# Patient Record
Sex: Male | Born: 1960 | Race: White | Hispanic: No | Smoking: Never smoker
Health system: Southern US, Community
[De-identification: ages and names within clinical notes are randomized; demographics above are authoritative.]

## PROBLEM LIST (undated history)

## (undated) DIAGNOSIS — Z87442 Personal history of urinary calculi: Secondary | ICD-10-CM

## (undated) HISTORY — PX: KNEE SURGERY: SHX244

---

## 2018-02-08 ENCOUNTER — Encounter (HOSPITAL_BASED_OUTPATIENT_CLINIC_OR_DEPARTMENT_OTHER): Payer: Self-pay

## 2018-02-08 ENCOUNTER — Emergency Department (HOSPITAL_BASED_OUTPATIENT_CLINIC_OR_DEPARTMENT_OTHER): Payer: BLUE CROSS/BLUE SHIELD

## 2018-02-08 ENCOUNTER — Other Ambulatory Visit: Payer: Self-pay

## 2018-02-08 ENCOUNTER — Emergency Department (HOSPITAL_BASED_OUTPATIENT_CLINIC_OR_DEPARTMENT_OTHER)
Admission: EM | Admit: 2018-02-08 | Discharge: 2018-02-08 | Disposition: A | Discharge status: Home / Self Care | Payer: BLUE CROSS/BLUE SHIELD | Attending: Emergency Medicine | Admitting: Emergency Medicine

## 2018-02-08 DIAGNOSIS — N200 Calculus of kidney: Secondary | ICD-10-CM | POA: Diagnosis not present

## 2018-02-08 DIAGNOSIS — R1032 Left lower quadrant pain: Secondary | ICD-10-CM | POA: Diagnosis present

## 2018-02-08 LAB — COMPREHENSIVE METABOLIC PANEL
ALK PHOS: 57 U/L (ref 38–126)
ALT: 42 U/L (ref 17–63)
ANION GAP: 11 (ref 5–15)
AST: 39 U/L (ref 15–41)
Albumin: 4.9 g/dL (ref 3.5–5.0)
BUN: 16 mg/dL (ref 6–20)
CALCIUM: 9.3 mg/dL (ref 8.9–10.3)
CHLORIDE: 107 mmol/L (ref 101–111)
CO2: 24 mmol/L (ref 22–32)
CREATININE: 1.39 mg/dL — AB (ref 0.61–1.24)
GFR, EST NON AFRICAN AMERICAN: 54 mL/min — AB (ref >60)
Glucose, Bld: 134 mg/dL — ABNORMAL HIGH (ref 65–99)
Potassium: 4.6 mmol/L (ref 3.5–5.1)
SODIUM: 142 mmol/L (ref 135–145)
Total Bilirubin: 0.7 mg/dL (ref 0.3–1.2)
Total Protein: 7.9 g/dL (ref 6.5–8.1)

## 2018-02-08 LAB — URINALYSIS, ROUTINE W REFLEX MICROSCOPIC
BILIRUBIN URINE: NEGATIVE
Glucose, UA: NEGATIVE mg/dL
KETONES UR: NEGATIVE mg/dL
LEUKOCYTES UA: NEGATIVE
NITRITE: NEGATIVE
Protein, ur: 30 mg/dL — AB
Specific Gravity, Urine: >1.03 — ABNORMAL HIGH (ref 1.005–1.030)
pH: 6 (ref 5.0–8.0)

## 2018-02-08 LAB — CBC WITH DIFFERENTIAL/PLATELET
Basophils Absolute: 0 10*3/uL (ref 0.0–0.1)
Basophils Relative: 0 %
EOS ABS: 0 10*3/uL (ref 0.0–0.7)
EOS PCT: 0 %
HCT: 46.3 % (ref 39.0–52.0)
Hemoglobin: 16.1 g/dL (ref 13.0–17.0)
LYMPHS ABS: 1.5 10*3/uL (ref 0.7–4.0)
Lymphocytes Relative: 11 %
MCH: 29.5 pg (ref 26.0–34.0)
MCHC: 34.8 g/dL (ref 30.0–36.0)
MCV: 85 fL (ref 78.0–100.0)
MONOS PCT: 4 %
Monocytes Absolute: 0.5 10*3/uL (ref 0.1–1.0)
Neutro Abs: 11.5 10*3/uL — ABNORMAL HIGH (ref 1.7–7.7)
Neutrophils Relative %: 85 %
PLATELETS: 216 10*3/uL (ref 150–400)
RBC: 5.45 MIL/uL (ref 4.22–5.81)
RDW: 15.1 % (ref 11.5–15.5)
WBC: 13.5 10*3/uL — ABNORMAL HIGH (ref 4.0–10.5)

## 2018-02-08 LAB — URINALYSIS, MICROSCOPIC (REFLEX)

## 2018-02-08 LAB — LIPASE, BLOOD: LIPASE: 37 U/L (ref 11–51)

## 2018-02-08 MED ORDER — TAMSULOSIN HCL 0.4 MG PO CAPS: 0.4000 mg | ORAL_CAPSULE | Freq: Every day | ORAL | 0 refills | Status: AC

## 2018-02-08 MED ORDER — KETOROLAC TROMETHAMINE 10 MG PO TABS: 10.0000 mg | ORAL_TABLET | Freq: Four times a day (QID) | ORAL | 0 refills | Status: DC | PRN

## 2018-02-08 MED ORDER — ONDANSETRON HCL 4 MG/2ML IJ SOLN
4.0000 mg | Freq: Once | INTRAMUSCULAR | Status: AC
  Administered 2018-02-08: 4 mg via INTRAVENOUS
  Filled 2018-02-08: qty 2

## 2018-02-08 MED ORDER — IOPAMIDOL (ISOVUE-300) INJECTION 61%
100.0000 mL | Freq: Once | INTRAVENOUS | Status: AC | PRN
  Administered 2018-02-08: 100 mL via INTRAVENOUS

## 2018-02-08 MED ORDER — SODIUM CHLORIDE 0.9 % IV BOLUS
1000.0000 mL | Freq: Once | INTRAVENOUS | Status: AC
  Administered 2018-02-08: 1000 mL via INTRAVENOUS

## 2018-02-08 MED ORDER — KETOROLAC TROMETHAMINE 30 MG/ML IJ SOLN
30.0000 mg | Freq: Once | INTRAMUSCULAR | Status: AC
  Administered 2018-02-08: 30 mg via INTRAVENOUS
  Filled 2018-02-08: qty 1

## 2018-02-08 MED ORDER — HYDROCODONE-ACETAMINOPHEN 5-325 MG PO TABS: 1.0000 | ORAL_TABLET | Freq: Four times a day (QID) | ORAL | 0 refills | Status: DC | PRN

## 2018-02-08 NOTE — ED Notes (Signed)
ED Provider at bedside. 

## 2018-02-08 NOTE — ED Provider Notes (Signed)
Emergency Department Provider Note   I have reviewed the triage vital signs and the nursing notes.   HISTORY  Chief Complaint Abdominal Pain   HPI Duane Gregory is a 57 y.o. male presents to the ED with LLQ abdominal pain which began abruptly. Patient reports some radiation of symptoms to the flank. No prior history of kidney stones or diverticulitis. No diarrhea or vomiting. No fever or chills. Patient denies any abdominal surgery history. No modifying factors. Pain is severe and constant.   History reviewed. No pertinent past medical history.  There are no active problems to display for this patient.   Past Surgical History:  Procedure Laterality Date  . KNEE SURGERY        Allergies Patient has no known allergies.  No family history on file.  Social History Social History   Tobacco Use  . Smoking status: Never Smoker  . Smokeless tobacco: Never Used  Substance Use Topics  . Alcohol use: Yes    Comment: occ  . Drug use: Never    Review of Systems  Constitutional: No fever/chills Eyes: No visual changes. ENT: No sore throat. Cardiovascular: Denies chest pain. Respiratory: Denies shortness of breath. Gastrointestinal: Positive LLQ abdominal/flank pain. Positive nausea, no vomiting.  No diarrhea.  No constipation. Genitourinary: Negative for dysuria. Musculoskeletal: Negative for back pain. Skin: Negative for rash. Neurological: Negative for headaches, focal weakness or numbness.  10-point ROS otherwise negative.  ____________________________________________   PHYSICAL EXAM:  VITAL SIGNS: ED Triage Vitals  Enc Vitals Group     BP 02/08/18 1813 (!) 153/96     Pulse Rate 02/08/18 1813 71     Resp 02/08/18 1813 20     Temp 02/08/18 1813 97.9 F (36.6 C)     Temp Source 02/08/18 1813 Oral     SpO2 02/08/18 1813 100 %     Weight 02/08/18 1851 230 lb (104.3 kg)     Height 02/08/18 1851 6' (1.829 m)     Pain Score 02/08/18 1810 4    Constitutional: Alert and oriented. Well appearing and in no acute distress. Eyes: Conjunctivae are normal.  Head: Atraumatic. Nose: No congestion/rhinnorhea. Mouth/Throat: Mucous membranes are moist.  Neck: No stridor.  Cardiovascular: Normal rate, regular rhythm. Good peripheral circulation. Grossly normal heart sounds.   Respiratory: Normal respiratory effort.  No retractions. Lungs CTAB. Gastrointestinal: Soft with focal LLQ abdominal pain without rebound or guarding. No distention.  Musculoskeletal: No lower extremity tenderness nor edema. No gross deformities of extremities. Neurologic:  Normal speech and language. No gross focal neurologic deficits are appreciated.  Skin:  Skin is warm, dry and intact. No rash noted.   ____________________________________________   LABS (all labs ordered are listed, but only abnormal results are displayed)  Labs Reviewed  CBC WITH DIFFERENTIAL/PLATELET - Abnormal; Notable for the following components:      Result Value   WBC 13.5 (*)    Neutro Abs 11.5 (*)    All other components within normal limits  COMPREHENSIVE METABOLIC PANEL - Abnormal; Notable for the following components:   Glucose, Bld 134 (*)    Creatinine, Ser 1.39 (*)    GFR calc non Af Amer 54 (*)    All other components within normal limits  URINALYSIS, ROUTINE W REFLEX MICROSCOPIC - Abnormal; Notable for the following components:   APPearance CLOUDY (*)    Specific Gravity, Urine >1.030 (*)    Hgb urine dipstick LARGE (*)    Protein, ur 30 (*)  All other components within normal limits  URINALYSIS, MICROSCOPIC (REFLEX) - Abnormal; Notable for the following components:   Bacteria, UA FEW (*)    All other components within normal limits  LIPASE, BLOOD   ____________________________________________  RADIOLOGY  Ct Abdomen Pelvis W Contrast  Result Date: 02/08/2018 CLINICAL DATA:  Abdominal pain with diverticulitis suspected EXAM: CT ABDOMEN AND PELVIS WITH  CONTRAST TECHNIQUE: Multidetector CT imaging of the abdomen and pelvis was performed using the standard protocol following bolus administration of intravenous contrast. CONTRAST:  ISOVUE-300 IOPAMIDOL (ISOVUE-300) INJECTION 61% COMPARISON:  None. FINDINGS: Lower chest:  No contributory findings. Hepatobiliary: Hepatic steatosis.No evidence of biliary obstruction or stone. Pancreas: Unremarkable. Spleen: Unremarkable. Adrenals/Urinary Tract: Negative adrenals. 1 cm stone at the left UPJ with minimal hydronephrosis. There are 2 left renal calculi measuring up to 5 mm. Unremarkable bladder. Stomach/Bowel: No obstruction. No appendicitis. Mild distal colonic diverticulosis. Vascular/Lymphatic: No acute vascular abnormality. No mass or adenopathy. Reproductive:No pathologic findings. Other: No ascites or pneumoperitoneum. Musculoskeletal: No acute abnormalities. IMPRESSION: 1. 1 cm left UPJ stone with minimal hydronephrosis. 2. Two left renal calculi measuring up to 5 mm. 3. Hepatic steatosis. 4. Mild distal colonic diverticulosis. Electronically Signed   By: Marnee Spring M.D.   On: 02/08/2018 19:17    ____________________________________________   PROCEDURES  Procedure(s) performed:   Procedures  None ____________________________________________   INITIAL IMPRESSION / ASSESSMENT AND PLAN / ED COURSE  Pertinent labs & imaging results that were available during my care of the patient were reviewed by me and considered in my medical decision making (see chart for details).  Patient presents to the ED with LLQ pain and flank pain. Suspicion elevated for renal stone but patient does have some tenderness in the LLQ abdominal pain. CT with contrast obtained which shows a large stone at the UPJ with mild hydro. Patient with significant relief with Toradol. No diverticulitis. Plan for Flomax, pain meds, and Urology follow up on Monday given the stone's size and likely need for intervention.  Discussed ED return precautions in detail and provided in writing.   At this time, I do not feel there is any life-threatening condition present. I have reviewed and discussed all results (EKG, imaging, lab, urine as appropriate), exam findings with patient. I have reviewed nursing notes and appropriate previous records.  I feel the patient is safe to be discharged home without further emergent workup. Discussed usual and customary return precautions. Patient and family (if present) verbalize understanding and are comfortable with this plan.  Patient will follow-up with their primary care provider. If they do not have a primary care provider, information for follow-up has been provided to them. All questions have been answered.  ____________________________________________  FINAL CLINICAL IMPRESSION(S) / ED DIAGNOSES  Final diagnoses:  Kidney stone on left side     MEDICATIONS GIVEN DURING THIS VISIT:  Medications  ketorolac (TORADOL) 30 MG/ML injection 30 mg (30 mg Intravenous Given 02/08/18 1850)  ondansetron (ZOFRAN) injection 4 mg (4 mg Intravenous Given 02/08/18 1850)  sodium chloride 0.9 % bolus 1,000 mL (0 mLs Intravenous Stopped 02/08/18 2003)  iopamidol (ISOVUE-300) 61 % injection 100 mL (100 mLs Intravenous Contrast Given 02/08/18 1902)     NEW OUTPATIENT MEDICATIONS STARTED DURING THIS VISIT:  Discharge Medication List as of 02/08/2018  8:08 PM    START taking these medications   Details  HYDROcodone-acetaminophen (NORCO/VICODIN) 5-325 MG tablet Take 1 tablet by mouth every 6 (six) hours as needed for severe pain., Starting Fri 02/08/2018,  Print    ketorolac (TORADOL) 10 MG tablet Take 1 tablet (10 mg total) by mouth every 6 (six) hours as needed., Starting Fri 02/08/2018, Print    tamsulosin (FLOMAX) 0.4 MG CAPS capsule Take 1 capsule (0.4 mg total) by mouth daily for 14 days., Starting Fri 02/08/2018, Until Fri 02/22/2018, Print        Note:  This document was prepared using  Dragon voice recognition software and may include unintentional dictation errors.  Alona Bene, MD Emergency Medicine    Duane Gregory, Duane Repress, MD 02/09/18 [phone]

## 2018-02-08 NOTE — ED Triage Notes (Signed)
C/o LLQ pain since 130pm-NAD-steady gait

## 2018-02-08 NOTE — Discharge Instructions (Addendum)
You have been seen in the Emergency Department (ED) today for pain that we believe based on your workup, is caused by kidney stones.  As we have discussed, please drink plenty of fluids.  Please make a follow up appointment with the physician(s) listed elsewhere in this documentation. ° °You may take pain medication as needed but ONLY as prescribed.  Please also take your prescribed Flomax daily.  We also recommend that you take over-the-counter ibuprofen regularly according to label instructions over the next 5 days.  Take it with meals to minimize stomach discomfort. ° °Please see your doctor as soon as possible as stones may take 1-3 weeks to pass and you may require additional care or medications. ° °Do not drink alcohol, drive or participate in any other potentially dangerous activities while taking opiate pain medication as it may make you sleepy. Do not take this medication with any other sedating medications, either prescription or over-the-counter. If you were prescribed Percocet or Vicodin, do not take these with acetaminophen (Tylenol) as it is already contained within these medications. °  °Take Vicodin as needed for severe pain.  This medication is an opiate (or narcotic) pain medication and can be habit forming.  Use it as little as possible to achieve adequate pain control.  Do not use or use it with extreme caution if you have a history of opiate abuse or dependence.  If you are on a pain contract with your primary care doctor or a pain specialist, be sure to let them know you were prescribed this medication today from the Emergency Department.  This medication is intended for your use only - do not give any to anyone else and keep it in a secure place where nobody else, especially children, have access to it.  It will also cause or worsen constipation, so you may want to consider taking an over-the-counter stool softener while you are taking this medication. ° °Return to the Emergency Department  (ED) or call your doctor if you have any worsening pain, fever, painful urination, are unable to urinate, or develop other symptoms that concern you. ° ° ° °Kidney Stones °Kidney stones (urolithiasis) are deposits that form inside your kidneys. The intense pain is caused by the stone moving through the urinary tract. When the stone moves, the ureter goes into spasm around the stone. The stone is usually passed in the urine.  °CAUSES  °A disorder that makes certain neck glands produce too much parathyroid hormone (primary hyperparathyroidism). °A buildup of uric acid crystals, similar to gout in your joints. °Narrowing (stricture) of the ureter. °A kidney obstruction present at birth (congenital obstruction). °Previous surgery on the kidney or ureters. °Numerous kidney infections. °SYMPTOMS  °Feeling sick to your stomach (nauseous). °Throwing up (vomiting). °Blood in the urine (hematuria). °Pain that usually spreads (radiates) to the groin. °Frequency or urgency of urination. °DIAGNOSIS  °Taking a history and physical exam. °Blood or urine tests. °CT scan. °Occasionally, an examination of the inside of the urinary bladder (cystoscopy) is performed. °TREATMENT  °Observation. °Increasing your fluid intake. °Extracorporeal shock wave lithotripsy--This is a noninvasive procedure that uses shock waves to break up kidney stones. °Surgery may be needed if you have severe pain or persistent obstruction. There are various surgical procedures. Most of the procedures are performed with the use of small instruments. Only small incisions are needed to accommodate these instruments, so recovery time is minimized. °The size, location, and chemical composition are all important variables that will determine the proper   choice of action for you. Talk to your health care provider to better understand your situation so that you will minimize the risk of injury to yourself and your kidney.  °HOME CARE INSTRUCTIONS  °Drink enough water  and fluids to keep your urine clear or pale yellow. This will help you to pass the stone or stone fragments. °Strain all urine through the provided strainer. Keep all particulate matter and stones for your health care provider to see. The stone causing the pain may be as small as a grain of salt. It is very important to use the strainer each and every time you pass your urine. The collection of your stone will allow your health care provider to analyze it and verify that a stone has actually passed. The stone analysis will often identify what you can do to reduce the incidence of recurrences. °Only take over-the-counter or prescription medicines for pain, discomfort, or fever as directed by your health care provider. °Keep all follow-up visits as told by your health care provider. This is important. °Get follow-up X-rays if required. The absence of pain does not always mean that the stone has passed. It may have only stopped moving. If the urine remains completely obstructed, it can cause loss of kidney function or even complete destruction of the kidney. It is your responsibility to make sure X-rays and follow-ups are completed. Ultrasounds of the kidney can show blockages and the status of the kidney. Ultrasounds are not associated with any radiation and can be performed easily in a matter of minutes. °Make changes to your daily diet as told by your health care provider. You may be told to: °Limit the amount of salt that you eat. °Eat 5 or more servings of fruits and vegetables each day. °Limit the amount of meat, poultry, fish, and eggs that you eat. °Collect a 24-hour urine sample as told by your health care provider. You may need to collect another urine sample every 6-12 months. °SEEK MEDICAL CARE IF: °You experience pain that is progressive and unresponsive to any pain medicine you have been prescribed. °SEEK IMMEDIATE MEDICAL CARE IF:  °Pain cannot be controlled with the prescribed medicine. °You have a  fever or shaking chills. °The severity or intensity of pain increases over 18 hours and is not relieved by pain medicine. °You develop a new onset of abdominal pain. °You feel faint or pass out. °You are unable to urinate. °  °This information is not intended to replace advice given to you by your health care provider. Make sure you discuss any questions you have with your health care provider. °  °Document Released: 10/02/2005 Document Revised: 06/23/2015 Document Reviewed: 03/05/2013 °Elsevier Interactive Patient Education ©2016 [employer] ° ° °

## 2018-02-11 ENCOUNTER — Other Ambulatory Visit: Payer: Self-pay | Admitting: Urology

## 2018-02-12 ENCOUNTER — Other Ambulatory Visit: Payer: Self-pay

## 2018-02-12 ENCOUNTER — Encounter (HOSPITAL_COMMUNITY): Payer: Self-pay | Admitting: *Deleted

## 2018-02-13 ENCOUNTER — Other Ambulatory Visit: Payer: Self-pay | Admitting: Urology

## 2018-02-13 NOTE — H&P (Signed)
CC/HPI: CC: Left flank pain   HPI: [name] is a 57 year old male with a history of sharp, intermittent left flank pain that started on 02/08/2018. His pain is associated with nausea/vomiting and general malaise. He denies fever/chills or gross hematuria. No prior history of nephrolithiasis. He had a CT stone study on 02/08/2018 that demonstrated a 1.3 cm left UPJ stone as well as a 4.8 mm left lower pole renal stone with mild hydronephrosis. His pain is currently controlled with Toradol and Vicodin. He is urinating without difficulty and has no complaints in that regard.     ALLERGIES: No Allergies    MEDICATIONS: Fish Oil  Flonase Allergy Relief  Multiple Vitamin     GU PSH: None   NON-GU PSH: None   GU PMH: None   NON-GU PMH: GERD Skin Cancer, History    FAMILY HISTORY: Cancer - Sister Gout - Brother Heart trouble - Father   SOCIAL HISTORY: Marital Status: Single Preferred Language: English; Ethnicity: Not Hispanic Or Latino; Race: White Current Smoking Status: Patient has never smoked.   Tobacco Use Assessment Completed: Used Tobacco in last 30 days? Does not use smokeless tobacco. Has never drank.  Does not use drugs. Drinks 2 caffeinated drinks per day. Has not had a blood transfusion.    REVIEW OF SYSTEMS:    GU Review Male:   Patient denies frequent urination, hard to postpone urination, burning/ pain with urination, get up at night to urinate, leakage of urine, stream starts and stops, trouble starting your stream, have to strain to urinate , erection problems, and penile pain.  Gastrointestinal (Upper):   Patient reports nausea and vomiting. Patient denies indigestion/ heartburn.  Gastrointestinal (Lower):   Patient reports constipation. Patient denies diarrhea.  Constitutional:   Patient reports fatigue and fever. Patient denies night sweats and weight loss.  Skin:   Patient denies skin rash/ lesion and itching.  Eyes:   Patient denies blurred vision and double  vision.  Ears/ Nose/ Throat:   Patient reports sore throat and sinus problems.   Hematologic/Lymphatic:   Patient denies swollen glands and easy bruising.  Cardiovascular:   Patient denies leg swelling and chest pains.  Respiratory:   Patient denies cough and shortness of breath.  Endocrine:   Patient reports excessive thirst.   Musculoskeletal:   Patient reports back pain. Patient denies joint pain.  Neurological:   Patient denies headaches and dizziness.  Psychologic:   Patient denies depression and anxiety.   VITAL SIGNS:      02/11/2018 09:43 AM  Weight 247 lb / 112.04 kg  Height 60 in / 152.4 cm  BP 151/84 mmHg  Pulse 73 /min  Temperature 97.5 F / 36.3 C  BMI 48.2 kg/m   MULTI-SYSTEM PHYSICAL EXAMINATION:    Constitutional: Well-nourished. No physical deformities. Normally developed. Good grooming.  Neck: Neck symmetrical, not swollen. Normal tracheal position.  Respiratory: No labored breathing, no use of accessory muscles. Normal breath sounds.  Cardiovascular: Regular rate and rhythm. No murmur, no gallop. Normal temperature, normal extremity pulses, no swelling, no varicosities.  Lymphatic: No enlargement of neck, axillae, groin.  Skin: No paleness, no jaundice, no cyanosis. No lesion, no ulcer, no rash.  Neurologic / Psychiatric: Oriented to time, oriented to place, oriented to person. No depression, no anxiety, no agitation.  Gastrointestinal: No mass, no tenderness, no rigidity, non obese abdomen.  Eyes: Normal conjunctivae. Normal eyelids.  Ears, Nose, Mouth, and Throat: Left ear no scars, no lesions, no masses.  Right ear no scars, no lesions, no masses. Nose no scars, no lesions, no masses. Normal hearing. Normal lips.  Musculoskeletal: Normal gait and station of head and neck.     PAST DATA REVIEWED:  Source Of History:  Patient   PROCEDURES:         KUB - [id number]  A single view of the abdomen is obtained.      1. Left proximal ureteral stone seen on KUB today.  No additional calcifications were seen along the expected course of either ureter or within the right renal pelvis. No calcifications noted within bladder. No boney abnormalities. No abnormal bowel/gas patterns or signs of free air.    ASSESSMENT:      ICD-10 Details  1 GU:   Renal and ureteral calculus - N20.2 Left, 1.3 cm left UPJ and 4.8 mm left lower pole renal stones   PLAN:           Orders X-Rays: KUB          Schedule         Document Letter(s):  Created for Patient: Clinical Summary         Notes:   We discussed the management of kidney stones. These options include observation, ureteroscopy, shockwave lithotripsy, and PCNL. We discussed which options are relevant to the patient's stone(s). We discussed the natural history of stones as well as the complications of untreated stones and the impact on quality of life without treatment as well as with each of the above listed treatments. We also discussed the efficacy of each treatment in its ability to clear the stone burden. With any of these management options I discussed the signs and symptoms of infection and the need for emergent treatment should these be experienced. For each option we discussed the ability of each procedure to clear the patient of their stone burden.  For observation I described the risks which include but are not limited to silent renal damage, life-threatening infection, need for emergent surgery, failure to pass stone, and pain.  For ureteroscopy I described the risks which include heart attack, stroke, pulmonary embolus, death, bleeding, infection, damage to contiguous structures, positioning injury, ureteral stricture, ureteral avulsion, ureteral injury, need for ureteral stent, inability to perform ureteroscopy, need for an interval procedure, inability to clear stone burden, stent discomfort and pain.  For shockwave lithotripsy I described the risks which include arrhythmia, kidney contusion, kidney  hemorrhage, need for transfusion, pain, inability to break up stone, inability to pass stone fragments, Steinstrasse, infection associated with obstructing stones, need for different surgical procedure, need for repeat shockwave lithotripsy, and death.  For PCNL I described the risks including positioning injury, pneumothorax, hydrothorax, need for chest tube, inability to clear stone burden, renal laceration, arterial venous fistula or malformation, need for embolization of kidney, loss of kidney or renal function, need for repeat procedure, need for prolonged nephrostomy tube, ureteral avulsion and inherent risks of general anesthesia.   We will proceed with left ESWL.         Next Appointment:      Next Appointment: 02/14/2018 01:45 PM    Appointment Type: Surgery     Location: [facility] [phone]    Provider: Jerilee Field, M.D.    Reason for Visit: WL/OP LT ESWL      * Signed by Rhoderick Moody, M.D. on 02/11/18 at 12:45 PM (EDT)*  Addendum: I reviewed the patient's chart, labs and imaging.

## 2018-02-14 ENCOUNTER — Ambulatory Visit (HOSPITAL_COMMUNITY)
Admission: RE | Admit: 2018-02-14 | Discharge: 2018-02-14 | Disposition: A | Discharge status: Home / Self Care | Payer: BLUE CROSS/BLUE SHIELD | Source: Ambulatory Visit | Attending: Urology | Admitting: Urology

## 2018-02-14 ENCOUNTER — Other Ambulatory Visit: Payer: Self-pay

## 2018-02-14 ENCOUNTER — Ambulatory Visit (HOSPITAL_COMMUNITY): Payer: BLUE CROSS/BLUE SHIELD

## 2018-02-14 ENCOUNTER — Encounter (HOSPITAL_COMMUNITY): Payer: Self-pay | Admitting: *Deleted

## 2018-02-14 ENCOUNTER — Encounter (HOSPITAL_COMMUNITY)
Admission: RE | Disposition: A | Discharge status: Home / Self Care | Payer: Self-pay | Source: Ambulatory Visit | Attending: Urology

## 2018-02-14 DIAGNOSIS — N201 Calculus of ureter: Secondary | ICD-10-CM

## 2018-02-14 DIAGNOSIS — N132 Hydronephrosis with renal and ureteral calculous obstruction: Secondary | ICD-10-CM | POA: Insufficient documentation

## 2018-02-14 DIAGNOSIS — Z6841 Body Mass Index (BMI) 40.0 and over, adult: Secondary | ICD-10-CM | POA: Diagnosis not present

## 2018-02-14 DIAGNOSIS — Z791 Long term (current) use of non-steroidal anti-inflammatories (NSAID): Secondary | ICD-10-CM | POA: Diagnosis not present

## 2018-02-14 DIAGNOSIS — Z85828 Personal history of other malignant neoplasm of skin: Secondary | ICD-10-CM | POA: Insufficient documentation

## 2018-02-14 DIAGNOSIS — E669 Obesity, unspecified: Secondary | ICD-10-CM | POA: Insufficient documentation

## 2018-02-14 HISTORY — DX: Personal history of urinary calculi: Z87.442

## 2018-02-14 HISTORY — PX: EXTRACORPOREAL SHOCK WAVE LITHOTRIPSY: SHX1557

## 2018-02-14 SURGERY — LITHOTRIPSY, ESWL
Anesthesia: LOCAL | Laterality: Left

## 2018-02-14 MED ORDER — ONDANSETRON HCL 4 MG PO TABS: 4.0000 mg | ORAL_TABLET | Freq: Every day | ORAL | 0 refills | Status: DC | PRN

## 2018-02-14 MED ORDER — SODIUM CHLORIDE 0.9 % IV SOLN
INTRAVENOUS | Status: DC
  Administered 2018-02-14 (×2): via INTRAVENOUS

## 2018-02-14 MED ORDER — KETOROLAC TROMETHAMINE 10 MG PO TABS: 10.0000 mg | ORAL_TABLET | Freq: Four times a day (QID) | ORAL | 0 refills | Status: DC | PRN

## 2018-02-14 MED ORDER — CIPROFLOXACIN HCL 500 MG PO TABS
500.0000 mg | ORAL_TABLET | ORAL | Status: AC
  Administered 2018-02-14: 500 mg via ORAL
  Filled 2018-02-14: qty 1

## 2018-02-14 MED ORDER — HYDROMORPHONE HCL 1 MG/ML IJ SOLN
INTRAMUSCULAR | Status: AC
  Filled 2018-02-14: qty 1

## 2018-02-14 MED ORDER — DIPHENHYDRAMINE HCL 25 MG PO CAPS
25.0000 mg | ORAL_CAPSULE | ORAL | Status: AC
  Administered 2018-02-14: 25 mg via ORAL
  Filled 2018-02-14: qty 1

## 2018-02-14 MED ORDER — DIAZEPAM 5 MG PO TABS
10.0000 mg | ORAL_TABLET | ORAL | Status: AC
  Administered 2018-02-14: 10 mg via ORAL
  Filled 2018-02-14: qty 2

## 2018-02-14 MED ORDER — HYDROMORPHONE HCL 1 MG/ML IJ SOLN
1.0000 mg | INTRAMUSCULAR | Status: AC
  Administered 2018-02-14: 1 mg via INTRAVENOUS

## 2018-02-14 NOTE — Interval H&P Note (Signed)
History and Physical Interval Note:  02/14/2018 2:09 PM  Duane Gregory  has occasional PVC's. VSS. No CP or SOB.     Jerilee Field

## 2018-02-14 NOTE — Op Note (Addendum)
Left UPJ stone, 10 mm  Left ESWL  Findings: PVC's resolved once he was more sedated and treatment started. HR went to 106-107, so I rechecked Temp which was 99.7. His HR declined to 100 and then to 88. VSS. Also, had about 1L IVF. The stone faded and fragments should pass, but he may need a staged procedure if they fail to pass.

## 2018-02-14 NOTE — Discharge Instructions (Signed)
Moderate Conscious Sedation, Adult, Care After °These instructions provide you with information about caring for yourself after your procedure. Your health care provider may also give you more specific instructions. Your treatment has been planned according to current medical practices, but problems sometimes occur. Call your health care provider if you have any problems or questions after your procedure. °What can I expect after the procedure? °After your procedure, it is common: °To feel sleepy for several hours. °To feel clumsy and have poor balance for several hours. °To have poor judgment for several hours. °To vomit if you eat too soon. ° °Follow these instructions at home: °For at least 24 hours after the procedure: ° °Do not: °Participate in activities where you could fall or become injured. °Drive. °Use heavy machinery. °Drink alcohol. °Take sleeping pills or medicines that cause drowsiness. °Make important decisions or sign legal documents. °Take care of children on your own. °Rest. °Eating and drinking °Follow the diet recommended by your health care provider. °If you vomit: °Drink water, juice, or soup when you can drink without vomiting. °Make sure you have little or no nausea before eating solid foods. °General instructions °Have a responsible adult stay with you until you are awake and alert. °Take over-the-counter and prescription medicines only as told by your health care provider. °If you smoke, do not smoke without supervision. °Keep all follow-up visits as told by your health care provider. This is important. °Contact a health care provider if: °You keep feeling nauseous or you keep vomiting. °You feel light-headed. °You develop a rash. °You have a fever. °Get help right away if: °You have trouble breathing. °This information is not intended to replace advice given to you by your health care provider. Make sure you discuss any questions you have with your health care provider. °Document Released:  07/23/2013 Document Revised: 03/06/2016 Document Reviewed: 01/22/2016 °Elsevier Interactive Patient Education © 2018 [employer] °Lithotripsy, Care After °This sheet gives you information about how to care for yourself after your procedure. Your health care provider may also give you more specific instructions. If you have problems or questions, contact your health care provider. °What can I expect after the procedure? °After the procedure, it is common to have: °· Some blood in your urine. This should only last for a few days. °· Soreness in your back, sides, or upper abdomen for a few days. °· Blotches or bruises on your back where the pressure wave entered the skin. °· Pain, discomfort, or nausea when pieces (fragments) of the kidney stone move through the tube that carries urine from the kidney to the bladder (ureter). Stone fragments may pass soon after the procedure, but they may continue to pass for up to 4-8 weeks. °? If you have severe pain or nausea, contact your health care provider. This may be caused by a large stone that was not broken up, and this may mean that you need more treatment. °· Some pain or discomfort during urination. °· Some pain or discomfort in the lower abdomen or (in men) at the base of the penis. ° °Follow these instructions at home: °Medicines °· Take over-the-counter and prescription medicines only as told by your health care provider. °· If you were prescribed an antibiotic medicine, take it as told by your health care provider. Do not stop taking the antibiotic even if you start to feel better. °· Do not drive for 24 hours if you were given a medicine to help you relax (sedative). °· Do not drive   or use heavy machinery while taking prescription pain medicine. °Eating and drinking °· Drink enough water and fluids to keep your urine clear or pale yellow. This helps any remaining pieces of the stone to pass. It can also help prevent new stones from forming. °· Eat plenty of fresh  fruits and vegetables. °· Follow instructions from your health care provider about eating and drinking restrictions. You may be instructed: °? To reduce how much salt (sodium) you eat or drink. Check ingredients and nutrition facts on packaged foods and beverages. °? To reduce how much meat you eat. °· Eat the recommended amount of calcium for your age and gender. Ask your health care provider how much calcium you should have. °General instructions °· Get plenty of rest. °· Most people can resume normal activities 1-2 days after the procedure. Ask your health care provider what activities are safe for you. °· If directed, strain all urine through the strainer that was provided by your health care provider. °? Keep all fragments for your health care provider to see. Any stones that are found may be sent to a medical lab for examination. The stone may be as small as a grain of salt. °· Keep all follow-up visits as told by your health care provider. This is important. °Contact a health care provider if: °· You have pain that is severe or does not get better with medicine. °· You have nausea that is severe or does not go away. °· You have blood in your urine longer than your health care provider told you to expect. °· You have more blood in your urine. °· You have pain during urination that does not go away. °· You urinate more frequently than usual and this does not go away. °· You develop a rash or any other possible signs of an allergic reaction. °Get help right away if: °· You have severe pain in your back, sides, or upper abdomen. °· You have severe pain while urinating. °· Your urine is very dark red. °· You have blood in your stool (feces). °· You cannot pass any urine at all. °· You feel a strong urge to urinate after emptying your bladder. °· You have a fever or chills. °· You develop shortness of breath, difficulty breathing, or chest pain. °· You have severe nausea that leads to persistent vomiting. °· You  faint. °Summary °· After this procedure, it is common to have some pain, discomfort, or nausea when pieces (fragments) of the kidney stone move through the tube that carries urine from the kidney to the bladder (ureter). If this pain or nausea is severe, however, you should contact your health care provider. °· Most people can resume normal activities 1-2 days after the procedure. Ask your health care provider what activities are safe for you. °· Drink enough water and fluids to keep your urine clear or pale yellow. This helps any remaining pieces of the stone to pass, and it can help prevent new stones from forming. °· If directed, strain your urine and keep all fragments for your health care provider to see. Fragments or stones may be as small as a grain of salt. °· Get help right away if you have severe pain in your back, sides, or upper abdomen or have severe pain while urinating. °This information is not intended to replace advice given to you by your health care provider. Make sure you discuss any questions you have with your health care provider. °Document Released: 10/22/2007 Document Revised:   08/23/2016 Document Reviewed: 08/23/2016 °Elsevier Interactive Patient Education © 2018 [employer] ° °

## 2018-02-14 NOTE — Interval H&P Note (Signed)
History and Physical Interval Note:  02/14/2018 2:04 PM  Duane Gregory  has presented today for surgery, with the diagnosis of LEFT URETEROPELVIC JUNCTION STONE  The various methods of treatment have been discussed with the patient and family. After consideration of risks, benefits and other options for treatment, the patient has consented to  Procedure(s): LEFT EXTRACORPOREAL SHOCK WAVE LITHOTRIPSY (ESWL) (Left) as a surgical intervention .  The patient's history has been reviewed, patient examined, no change in status, stable for surgery.  I have reviewed the patient's chart and labs. KUB - stone remains at left UPJ. Pt denies fever, chills, nausea, dysuria or hematuria. He has left flank pain and received pain meds in pre-op area. Questions were answered to the patient's satisfaction.     Jerilee Field

## 2018-02-18 ENCOUNTER — Encounter (HOSPITAL_COMMUNITY): Payer: Self-pay | Admitting: Urology

## 2018-02-19 ENCOUNTER — Emergency Department (HOSPITAL_BASED_OUTPATIENT_CLINIC_OR_DEPARTMENT_OTHER): Payer: BLUE CROSS/BLUE SHIELD

## 2018-02-19 ENCOUNTER — Emergency Department (HOSPITAL_BASED_OUTPATIENT_CLINIC_OR_DEPARTMENT_OTHER)
Admission: EM | Admit: 2018-02-19 | Discharge: 2018-02-20 | Disposition: A | Discharge status: Home / Self Care | Payer: BLUE CROSS/BLUE SHIELD | Source: Home / Self Care | Attending: Emergency Medicine | Admitting: Emergency Medicine

## 2018-02-19 ENCOUNTER — Other Ambulatory Visit: Payer: Self-pay

## 2018-02-19 ENCOUNTER — Encounter (HOSPITAL_BASED_OUTPATIENT_CLINIC_OR_DEPARTMENT_OTHER): Payer: Self-pay | Admitting: *Deleted

## 2018-02-19 DIAGNOSIS — Z85828 Personal history of other malignant neoplasm of skin: Secondary | ICD-10-CM | POA: Insufficient documentation

## 2018-02-19 DIAGNOSIS — Z87442 Personal history of urinary calculi: Secondary | ICD-10-CM | POA: Insufficient documentation

## 2018-02-19 DIAGNOSIS — R1013 Epigastric pain: Secondary | ICD-10-CM | POA: Insufficient documentation

## 2018-02-19 DIAGNOSIS — Z79899 Other long term (current) drug therapy: Secondary | ICD-10-CM

## 2018-02-19 DIAGNOSIS — N201 Calculus of ureter: Secondary | ICD-10-CM | POA: Insufficient documentation

## 2018-02-19 DIAGNOSIS — R109 Unspecified abdominal pain: Secondary | ICD-10-CM

## 2018-02-19 DIAGNOSIS — R630 Anorexia: Secondary | ICD-10-CM | POA: Insufficient documentation

## 2018-02-19 DIAGNOSIS — R112 Nausea with vomiting, unspecified: Secondary | ICD-10-CM | POA: Insufficient documentation

## 2018-02-19 DIAGNOSIS — Z7951 Long term (current) use of inhaled steroids: Secondary | ICD-10-CM | POA: Insufficient documentation

## 2018-02-19 DIAGNOSIS — R110 Nausea: Secondary | ICD-10-CM

## 2018-02-19 LAB — CBC WITH DIFFERENTIAL/PLATELET
Basophils Absolute: 0 10*3/uL (ref 0.0–0.1)
Basophils Relative: 0 %
Eosinophils Absolute: 0 10*3/uL (ref 0.0–0.7)
Eosinophils Relative: 0 %
HEMATOCRIT: 42.9 % (ref 39.0–52.0)
HEMOGLOBIN: 15.3 g/dL (ref 13.0–17.0)
LYMPHS ABS: 0.8 10*3/uL (ref 0.7–4.0)
LYMPHS PCT: 11 %
MCH: 29.8 pg (ref 26.0–34.0)
MCHC: 35.7 g/dL (ref 30.0–36.0)
MCV: 83.6 fL (ref 78.0–100.0)
MONOS PCT: 9 %
Monocytes Absolute: 0.7 10*3/uL (ref 0.1–1.0)
NEUTROS ABS: 6.2 10*3/uL (ref 1.7–7.7)
NEUTROS PCT: 80 %
Platelets: 164 10*3/uL (ref 150–400)
RBC: 5.13 MIL/uL (ref 4.22–5.81)
RDW: 13.8 % (ref 11.5–15.5)
WBC: 7.9 10*3/uL (ref 4.0–10.5)

## 2018-02-19 LAB — COMPREHENSIVE METABOLIC PANEL
ALBUMIN: 3.9 g/dL (ref 3.5–5.0)
ALT: 28 U/L (ref 17–63)
ANION GAP: 12 (ref 5–15)
AST: 26 U/L (ref 15–41)
Alkaline Phosphatase: 54 U/L (ref 38–126)
BILIRUBIN TOTAL: 1.1 mg/dL (ref 0.3–1.2)
BUN: 18 mg/dL (ref 6–20)
CO2: 22 mmol/L (ref 22–32)
Calcium: 9.5 mg/dL (ref 8.9–10.3)
Chloride: 102 mmol/L (ref 101–111)
Creatinine, Ser: 1.27 mg/dL — ABNORMAL HIGH (ref 0.61–1.24)
GFR calc non Af Amer: >60 mL/min (ref >60)
Glucose, Bld: 102 mg/dL — ABNORMAL HIGH (ref 65–99)
POTASSIUM: 3.7 mmol/L (ref 3.5–5.1)
SODIUM: 136 mmol/L (ref 135–145)
Total Protein: 7.1 g/dL (ref 6.5–8.1)

## 2018-02-19 LAB — URINALYSIS, ROUTINE W REFLEX MICROSCOPIC
GLUCOSE, UA: NEGATIVE mg/dL
LEUKOCYTES UA: NEGATIVE
Nitrite: NEGATIVE
PH: 5.5 (ref 5.0–8.0)
PROTEIN: NEGATIVE mg/dL
Specific Gravity, Urine: 1.025 (ref 1.005–1.030)

## 2018-02-19 LAB — URINALYSIS, MICROSCOPIC (REFLEX)

## 2018-02-19 LAB — LIPASE, BLOOD: Lipase: 29 U/L (ref 11–51)

## 2018-02-19 MED ORDER — ONDANSETRON HCL 4 MG/2ML IJ SOLN
4.0000 mg | Freq: Once | INTRAMUSCULAR | Status: AC
  Administered 2018-02-19: 4 mg via INTRAVENOUS
  Filled 2018-02-19: qty 2

## 2018-02-19 MED ORDER — SODIUM CHLORIDE 0.9 % IV BOLUS
1000.0000 mL | Freq: Once | INTRAVENOUS | Status: AC
  Administered 2018-02-19: 1000 mL via INTRAVENOUS

## 2018-02-19 MED ORDER — METOCLOPRAMIDE HCL 5 MG/ML IJ SOLN
10.0000 mg | Freq: Once | INTRAMUSCULAR | Status: AC
  Administered 2018-02-19: 10 mg via INTRAVENOUS
  Filled 2018-02-19: qty 2

## 2018-02-19 MED ORDER — GI COCKTAIL ~~LOC~~
30.0000 mL | Freq: Once | ORAL | Status: AC
  Administered 2018-02-19: 30 mL via ORAL
  Filled 2018-02-19: qty 30

## 2018-02-19 NOTE — ED Provider Notes (Signed)
[facility] EMERGENCY DEPARTMENT Provider Note  CSN: [id number] Arrival date & time: 02/19/18 1801  Chief Complaint(s) Abdominal Pain  HPI Duane Gregory is a 57 y.o. male   The history is provided by the patient.  Abdominal Pain   Episode onset: 2-4 days. The problem occurs constantly. Progression since onset: fluctuating. Associated with: has been taking ketoralac for recent renal stone. The pain is located in the epigastric region. The quality of the pain is aching. The pain is moderate. Associated symptoms include anorexia, nausea and vomiting (NBNB). Pertinent negatives include fever, diarrhea, hematochezia, melena and constipation. The symptoms are aggravated by eating. Nothing relieves the symptoms.    Past Medical History Past Medical History:  Diagnosis Date  . History of kidney stones    There are no active problems to display for this patient.  Home Medication(s) Prior to Admission medications   Medication Sig Start Date End Date Taking? Authorizing Provider  ondansetron (ZOFRAN) 4 MG tablet Take 1 tablet (4 mg total) by mouth daily as needed for nausea or vomiting. 02/14/18 02/14/19 Yes Jerilee Field, MD  HYDROcodone-acetaminophen (NORCO/VICODIN) 5-325 MG tablet Take 1 tablet by mouth every 6 (six) hours as needed for severe pain. 02/08/18   Long, Arlyss Repress, MD  ketorolac (TORADOL) 10 MG tablet Take 1 tablet (10 mg total) by mouth every 6 (six) hours as needed. 02/16/18   Jerilee Field, MD  metoCLOPramide (REGLAN) 10 MG tablet Take 1 tablet (10 mg total) by mouth every 6 (six) hours. 02/20/18   Ephriam Turman, Amadeo Garnet, MD  tamsulosin (FLOMAX) 0.4 MG CAPS capsule Take 1 capsule (0.4 mg total) by mouth daily for 14 days. 02/08/18 02/22/18  Long, Arlyss Repress, MD                                                                                                                                    Past Surgical History Past Surgical History:  Procedure Laterality Date  .  EXTRACORPOREAL SHOCK WAVE LITHOTRIPSY Left 02/14/2018   Procedure: LEFT EXTRACORPOREAL SHOCK WAVE LITHOTRIPSY (ESWL);  Surgeon: Jerilee Field, MD;  Location: [facility] ORS;  Service: Urology;  Laterality: Left;  . KNEE SURGERY     Family History No family history on file.  Social History Social History   Tobacco Use  . Smoking status: Never Smoker  . Smokeless tobacco: Never Used  Substance Use Topics  . Alcohol use: Yes    Comment: occ  . Drug use: Never   Allergies Patient has no known allergies.  Review of Systems Review of Systems  Constitutional: Negative for fever.  Gastrointestinal: Positive for abdominal pain, anorexia, nausea and vomiting (NBNB). Negative for constipation, diarrhea, hematochezia and melena.   All other systems are reviewed and are negative for acute change except as noted in the HPI  Physical Exam Vital Signs  I have reviewed the triage vital signs BP (!) 172/98 (BP Location: Right Arm)  Pulse 75   Temp 98.5 F (36.9 C) (Oral)   Resp 20   Ht 6' (1.829 m)   Wt 104.3 kg (230 lb)   SpO2 100%   BMI 31.19 kg/m   Physical Exam  Constitutional: He is oriented to person, place, and time. He appears well-developed and well-nourished. No distress.  HENT:  Head: Normocephalic and atraumatic.  Right Ear: External ear normal.  Left Ear: External ear normal.  Nose: Nose normal.  Mouth/Throat: Mucous membranes are normal. No trismus in the jaw.  Eyes: Conjunctivae and EOM are normal. No scleral icterus.  Neck: Normal range of motion and phonation normal.  Cardiovascular: Normal rate and regular rhythm.  Pulmonary/Chest: Effort normal. No stridor. No respiratory distress.  Abdominal: He exhibits no distension. There is tenderness in the epigastric area. There is no rigidity, no rebound and no guarding.  Musculoskeletal: Normal range of motion. He exhibits no edema.  Neurological: He is alert and oriented to person, place, and time.  Skin: He is not  diaphoretic.  Psychiatric: He has a normal mood and affect. His behavior is normal.  Vitals reviewed.   ED Results and Treatments Labs (all labs ordered are listed, but only abnormal results are displayed) Labs Reviewed  URINALYSIS, ROUTINE W REFLEX MICROSCOPIC - Abnormal; Notable for the following components:      Result Value   Hgb urine dipstick MODERATE (*)    Bilirubin Urine SMALL (*)    Ketones, ur >80 (*)    All other components within normal limits  URINALYSIS, MICROSCOPIC (REFLEX) - Abnormal; Notable for the following components:   Bacteria, UA MANY (*)    All other components within normal limits  COMPREHENSIVE METABOLIC PANEL - Abnormal; Notable for the following components:   Glucose, Bld 102 (*)    Creatinine, Ser 1.27 (*)    All other components within normal limits  CBC WITH DIFFERENTIAL/PLATELET  LIPASE, BLOOD                                                                                                                         EKG  EKG Interpretation  Date/Time:    Ventricular Rate:    PR Interval:    QRS Duration:   QT Interval:    QTC Calculation:   R Axis:     Text Interpretation:        Radiology Dg Abd Acute W/chest  Result Date: 02/19/2018 CLINICAL DATA:  57 year old male with abdominal pain. EXAM: DG ABDOMEN ACUTE W/ 1V CHEST COMPARISON:  Abdominal radiograph dated 02/14/2018 FINDINGS: The lungs are clear. There is no pleural effusion or pneumothorax. The cardiac silhouette is within normal limits. There is no bowel dilatation or evidence of obstruction. No free air. There is a 5 mm a more first calcification over the inferior aspect of the left renal silhouette corresponding to the stone seen on the CT of 02/08/2018. A 3 mm radiopaque focus adjacent to the left L3 transverse process may represent vascular  calcification. The osseous structures and soft tissues appear unremarkable. IMPRESSION: 1. No acute cardiopulmonary process. 2. No evidence of bowel  obstruction. 3. Left renal calculus. Electronically Signed   By: Elgie Collard M.D.   On: 02/19/2018 22:02   Pertinent labs & imaging results that were available during my care of the patient were reviewed by me and considered in my medical decision making (see chart for details).  Medications Ordered in ED Medications  sodium chloride 0.9 % bolus 1,000 mL (0 mLs Intravenous Stopped 02/19/18 2348)  ondansetron (ZOFRAN) injection 4 mg (4 mg Intravenous Given 02/19/18 2138)  gi cocktail (Maalox,Lidocaine,Donnatal) (30 mLs Oral Given 02/19/18 2138)  metoCLOPramide (REGLAN) injection 10 mg (10 mg Intravenous Given 02/19/18 2348)                                                                                                                                    Procedures Procedures  (including critical care time)  Medical Decision Making / ED Course I have reviewed the nursing notes for this encounter and the patient's prior records (if available in EHR or on provided paperwork).    Abdominal discomfort with epigastric abdominal pain.  No evidence of peritonitis.  No distention.  Bedside ultrasound not suspicious for acute cholecystitis.  Labs without evidence of leukocytosis or anemia.  No significant electrolyte derangements.  Renal function close to patient's recent baseline.  No evidence of biliary obstruction or pancreatitis.  Possible gastritis versus peptic ulcer disease. No melena suspicious for bleeding ulcer.  Acute abdominal series without evidence of bowel perforation.  Noted to have diffuse gaseous pattern that was not suspicious for bowel obstruction.   Presentation is not consistent with renal colic from recent stones.   Low suspicion for serious intra-abdominal inflammatory/infectious process.  Patient provided with IV fluids, antiemetics, GI cocktail.  Able to tolerate oral hydration and intake.  The patient appears reasonably screened and/or stabilized for discharge and I doubt  any other medical condition or other [facility] requiring further screening, evaluation, or treatment in the ED at this time prior to discharge.  The patient is safe for discharge with strict return precautions.   Final Clinical Impression(s) / ED Diagnoses Final diagnoses:  Abdominal pain  Nausea   Disposition: Discharge  Condition: Good  I have discussed the results, Dx and Tx plan with the patient who expressed understanding and agree(s) with the plan. Discharge instructions discussed at great length. The patient was given strict return precautions who verbalized understanding of the instructions. No further questions at time of discharge.    ED Discharge Orders        Ordered    metoCLOPramide (REGLAN) 10 MG tablet  Every 6 hours     02/20/18 0011       Follow Up: Richmond Campbell., PA-C [address] [place] [address] [phone]  Schedule an appointment as soon as possible for a visit  in 3-5 days, If symptoms do not improve or  worsen      This chart was dictated using voice recognition software.  Despite best efforts to proofread,  errors can occur which can change the documentation meaning.   Nira Conn, MD 02/20/18 [phone]

## 2018-02-19 NOTE — ED Notes (Signed)
ED Provider at bedside. 

## 2018-02-19 NOTE — ED Notes (Signed)
Pt wanting pain and nausea medication. Dr. Eudelia Bunch made aware.

## 2018-02-19 NOTE — ED Triage Notes (Signed)
Left flank pain. He was diagnosed with kidney stones 10 days ago. States the pain has been coming and going.

## 2018-02-20 ENCOUNTER — Ambulatory Visit (HOSPITAL_COMMUNITY): Payer: BLUE CROSS/BLUE SHIELD

## 2018-02-20 ENCOUNTER — Other Ambulatory Visit: Payer: Self-pay | Admitting: Urology

## 2018-02-20 ENCOUNTER — Ambulatory Visit (HOSPITAL_COMMUNITY)
Admission: RE | Admit: 2018-02-20 | Discharge: 2018-02-20 | Disposition: A | Discharge status: Home / Self Care | Payer: BLUE CROSS/BLUE SHIELD | Source: Other Acute Inpatient Hospital | Attending: Urology | Admitting: Urology

## 2018-02-20 ENCOUNTER — Encounter (HOSPITAL_COMMUNITY): Payer: Self-pay | Admitting: *Deleted

## 2018-02-20 ENCOUNTER — Ambulatory Visit (HOSPITAL_COMMUNITY): Payer: BLUE CROSS/BLUE SHIELD | Admitting: Certified Registered Nurse Anesthetist

## 2018-02-20 ENCOUNTER — Encounter (HOSPITAL_COMMUNITY)
Admission: RE | Disposition: A | Discharge status: Home / Self Care | Payer: Self-pay | Source: Other Acute Inpatient Hospital | Attending: Urology

## 2018-02-20 HISTORY — PX: HOLMIUM LASER APPLICATION: SHX5852

## 2018-02-20 HISTORY — PX: CYSTOSCOPY/RETROGRADE/URETEROSCOPY/STONE EXTRACTION WITH BASKET: SHX5317

## 2018-02-20 SURGERY — CYSTOSCOPY, WITH CALCULUS REMOVAL USING BASKET
Anesthesia: General

## 2018-02-20 MED ORDER — ONDANSETRON HCL 4 MG/2ML IJ SOLN
INTRAMUSCULAR | Status: AC
  Filled 2018-02-20: qty 2

## 2018-02-20 MED ORDER — LIDOCAINE 2% (20 MG/ML) 5 ML SYRINGE
INTRAMUSCULAR | Status: DC | PRN
Start: 2018-02-20 — End: 2018-02-20
  Administered 2018-02-20: 100 mg via INTRAVENOUS

## 2018-02-20 MED ORDER — MIDAZOLAM HCL 2 MG/2ML IJ SOLN
INTRAMUSCULAR | Status: AC
Start: 2018-02-20 — End: ?
  Filled 2018-02-20: qty 2

## 2018-02-20 MED ORDER — HYDROMORPHONE HCL 1 MG/ML IJ SOLN: 0.2500 mg | INTRAMUSCULAR | Status: DC | PRN

## 2018-02-20 MED ORDER — LIDOCAINE 2% (20 MG/ML) 5 ML SYRINGE
INTRAMUSCULAR | Status: AC
  Filled 2018-02-20: qty 5

## 2018-02-20 MED ORDER — ACETAMINOPHEN 10 MG/ML IV SOLN: 1000.0000 mg | Freq: Once | INTRAVENOUS | Status: DC | PRN

## 2018-02-20 MED ORDER — SODIUM CHLORIDE 0.9 % IR SOLN
Status: DC | PRN
  Administered 2018-02-20: 6000 mL via INTRAVESICAL

## 2018-02-20 MED ORDER — PHENAZOPYRIDINE HCL 200 MG PO TABS: 200.0000 mg | ORAL_TABLET | Freq: Three times a day (TID) | ORAL | 0 refills | Status: DC | PRN

## 2018-02-20 MED ORDER — CEFAZOLIN SODIUM-DEXTROSE 2-4 GM/100ML-% IV SOLN
2.0000 g | Freq: Once | INTRAVENOUS | Status: AC
  Administered 2018-02-20: 2 g via INTRAVENOUS

## 2018-02-20 MED ORDER — PROPOFOL 10 MG/ML IV BOLUS
INTRAVENOUS | Status: AC
Start: 2018-02-20 — End: ?
  Filled 2018-02-20: qty 20

## 2018-02-20 MED ORDER — IOHEXOL 300 MG/ML  SOLN
INTRAMUSCULAR | Status: DC | PRN
  Administered 2018-02-20: 5 mL via URETHRAL

## 2018-02-20 MED ORDER — 0.9 % SODIUM CHLORIDE (POUR BTL) OPTIME
TOPICAL | Status: DC | PRN
  Administered 2018-02-20: 1000 mL

## 2018-02-20 MED ORDER — FENTANYL CITRATE (PF) 100 MCG/2ML IJ SOLN
INTRAMUSCULAR | Status: AC
  Filled 2018-02-20: qty 2

## 2018-02-20 MED ORDER — ONDANSETRON HCL 4 MG/2ML IJ SOLN
INTRAMUSCULAR | Status: DC | PRN
  Administered 2018-02-20: 4 mg via INTRAVENOUS

## 2018-02-20 MED ORDER — HYDROCODONE-ACETAMINOPHEN 5-325 MG PO TABS: 1.0000 | ORAL_TABLET | Freq: Four times a day (QID) | ORAL | Status: DC | PRN

## 2018-02-20 MED ORDER — DEXAMETHASONE SODIUM PHOSPHATE 10 MG/ML IJ SOLN
INTRAMUSCULAR | Status: DC | PRN
  Administered 2018-02-20: 10 mg via INTRAVENOUS

## 2018-02-20 MED ORDER — MIDAZOLAM HCL 5 MG/5ML IJ SOLN
INTRAMUSCULAR | Status: DC | PRN
  Administered 2018-02-20: 1 mg via INTRAVENOUS

## 2018-02-20 MED ORDER — HYDROCODONE-ACETAMINOPHEN 5-325 MG PO TABS: 1.0000 | ORAL_TABLET | Freq: Four times a day (QID) | ORAL | 0 refills | Status: DC | PRN

## 2018-02-20 MED ORDER — HYDROCODONE-ACETAMINOPHEN 7.5-325 MG PO TABS: 1.0000 | ORAL_TABLET | Freq: Once | ORAL | Status: DC | PRN

## 2018-02-20 MED ORDER — METOCLOPRAMIDE HCL 10 MG PO TABS: 10.0000 mg | ORAL_TABLET | Freq: Four times a day (QID) | ORAL | 0 refills | Status: DC

## 2018-02-20 MED ORDER — FENTANYL CITRATE (PF) 100 MCG/2ML IJ SOLN
INTRAMUSCULAR | Status: DC | PRN
  Administered 2018-02-20 (×2): 50 ug via INTRAVENOUS

## 2018-02-20 MED ORDER — PROPOFOL 10 MG/ML IV BOLUS
INTRAVENOUS | Status: DC | PRN
  Administered 2018-02-20: 200 mg via INTRAVENOUS

## 2018-02-20 MED ORDER — DEXAMETHASONE SODIUM PHOSPHATE 10 MG/ML IJ SOLN
INTRAMUSCULAR | Status: AC
  Filled 2018-02-20: qty 1

## 2018-02-20 MED ORDER — LACTATED RINGERS IV SOLN
INTRAVENOUS | Status: DC
  Administered 2018-02-20: 16:00:00 via INTRAVENOUS

## 2018-02-20 MED ORDER — MEPERIDINE HCL 50 MG/ML IJ SOLN: 6.2500 mg | INTRAMUSCULAR | Status: DC | PRN

## 2018-02-20 MED ORDER — PROMETHAZINE HCL 25 MG/ML IJ SOLN: 6.2500 mg | INTRAMUSCULAR | Status: DC | PRN

## 2018-02-20 MED ORDER — CEFAZOLIN SODIUM-DEXTROSE 2-4 GM/100ML-% IV SOLN
INTRAVENOUS | Status: AC
  Filled 2018-02-20: qty 100

## 2018-02-20 SURGICAL SUPPLY — 22 items
BAG URO CATCHER STRL LF (MISCELLANEOUS) ×3 IMPLANT
BASKET ZERO TIP NITINOL 2.4FR (BASKET) IMPLANT
BSKT STON RTRVL ZERO TP 2.4FR (BASKET)
CATH INTERMIT  6FR 70CM (CATHETERS) ×1 IMPLANT
CLOTH BEACON ORANGE TIMEOUT ST (SAFETY) ×3 IMPLANT
COVER FOOTSWITCH UNIV (MISCELLANEOUS) IMPLANT
COVER SURGICAL LIGHT HANDLE (MISCELLANEOUS) ×3 IMPLANT
EXTRACTOR STONE 1.7FRX115CM (UROLOGICAL SUPPLIES) ×1 IMPLANT
FIBER LASER FLEXIVA 365 (UROLOGICAL SUPPLIES) IMPLANT
FIBER LASER TRAC TIP (UROLOGICAL SUPPLIES) ×1 IMPLANT
GLOVE BIOGEL M STRL SZ7.5 (GLOVE) ×3 IMPLANT
GOWN STRL REUS W/TWL LRG LVL3 (GOWN DISPOSABLE) ×5 IMPLANT
GUIDEWIRE ANG ZIPWIRE 038X150 (WIRE) IMPLANT
GUIDEWIRE STR DUAL SENSOR (WIRE) ×3 IMPLANT
IV NS 1000ML (IV SOLUTION) ×3
IV NS 1000ML BAXH (IV SOLUTION) ×2 IMPLANT
MANIFOLD NEPTUNE II (INSTRUMENTS) ×3 IMPLANT
PACK CYSTO (CUSTOM PROCEDURE TRAY) ×3 IMPLANT
SHEATH URETERAL 12FRX35CM (MISCELLANEOUS) ×1 IMPLANT
STENT URET 6FRX26 CONTOUR (STENTS) ×1 IMPLANT
TUBING CONNECTING 10 (TUBING) ×3 IMPLANT
TUBING UROLOGY SET (TUBING) ×3 IMPLANT

## 2018-02-20 NOTE — Discharge Instructions (Signed)

## 2018-02-20 NOTE — Transfer of Care (Signed)
Immediate Anesthesia Transfer of Care Note  Patient: Duane Gregory  Procedure(s) Performed: CYSTOSCOPY/RETROGRADE/URETEROSCOPY/STONE EXTRACTION WITH BASKET LEFT/LASER AND  STENT PLACEMENT (Left ) HOLMIUM LASER APPLICATION (N/A )  Patient Location: PACU  Anesthesia Type:General  Level of Consciousness: awake, alert  and oriented  Airway & Oxygen Therapy: Patient Spontanous Breathing and Patient connected to face mask oxygen  Post-op Assessment: Report given to RN and Post -op Vital signs reviewed and stable  Post vital signs: Reviewed and stable  Last Vitals:  Vitals Value Taken Time  BP    Temp    Pulse 74 02/20/2018  7:07 PM  Resp 16 02/20/2018  7:07 PM  SpO2 100 % 02/20/2018  7:07 PM  Vitals shown include unvalidated device data.  Last Pain:  Vitals:   02/20/18 1528  TempSrc:   PainSc: 3       Patients Stated Pain Goal: 4 (02/20/18 1528)  Complications: No apparent anesthesia complications

## 2018-02-20 NOTE — Op Note (Signed)
Preoperative diagnosis: Left ureteral calculi  Postoperative diagnosis: Left ureteral calculi  Procedures: 1.  Cystoscopy 2.  Left retrograde pyelography with interpretation 3.  Left ureteroscopy with laser lithotripsy and stone extraction 4.  Left ureteral stent placement (6 x 26 -no string)  Surgeon: Moody Bruins MD  Intraoperative findings: A retrograde pyelogram was performed with a 6 French ureteral catheter and Omnipaque contrast.  Findings indicated multiple filling defects in the distal left ureter and a filling defect within the proximal left ureter consistent with suspected calculi.  Anesthesia: General  EBL: Minimal  Complications: None  Specimens: Left ureteral calculi  Disposition of specimens: [facility]  Indication: Duane Gregory is a 57 year old gentleman who recently underwent shockwave lithotripsy for a 1.3 cm left renal calculus.  He presented earlier today with worsening pain symptoms.  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  Considering his significant pain symptoms, he did elect to proceed with ureteroscopic treatment.  The potential risks, complications, and the expected recovery process were discussed in detail.  He gave informed consent to proceed.  Description of procedure: The patient was taken to the operating room and a general anesthetic was administered.  He was given preoperative antibiotics, placed in the dorsolithotomy position, and prepped and draped in the usual sterile fashion.  Next, a preoperative timeout was performed.  Cystourethroscopy was then performed which revealed a normal anterior and posterior urethra.  Inspection of the bladder revealed the ureteral orifices to be in the expected anatomic locations and without other abnormalities within the bladder.  A 6 French ureteral catheter was then used to intubate the left  ureteral orifice.  Omnipaque contrast was injected in a retrograde pyelogram revealed multiple small filling defects in the distal left ureter along with a filling defect in the proximal left ureter consistent with his suspected calculi.  A 0.38 sensor guidewire was then advanced up the left ureter into the renal pelvis under fluoroscopic guidance.  The 6 French semirigid ureteroscope was then advanced into the distal left ureter and the multiple distal stone fragments were identified.  A 200 m holmium laser fiber was then used to perform laser lithotripsy and fragment the stones on a setting of 0.6 J and 6 Hz.  Once the stones were adequately fragmented, all stones were removed with an N gauge basket.  The mid and proximal ureter was then able to be examined with the semirigid ureteroscope up to a level just a few centimeters below the ureteropelvic junction.  No identifiable stone fragments were seen up to this level.  The ureter was then reexamined on withdrawal of the ureteroscope and no further fragments were identified.  A 35 cm 12/14 ureteral access sheath was then advanced over the wire into the proximal ureter up to the level that was previously examined with the semirigid ureteroscope.  The flexible digital ureteroscope was then advanced through the access sheath into the proximal ureter and into the renal collecting system.  No remaining proximal ureteral calculi were identified.  Within the collecting system, there were multiple fragments that remained within the lower pole calyx.  I did manipulate the larger of these fragments into the proximal ureter with the basket.  The holmium laser fiber was then again used to perform laser lithotripsy on a setting of 0.5 J and 25 Hz resulting in fragmentation of the stone into all fragments less than 3 mm.  I then perform laser  lithotripsy of the remaining fragments within the lower pole.  The flexible ureteroscope was then withdrawn and a 0.38 sensor guidewire  was left with in the renal pelvis as the ureteral access sheath was removed.  The wire was then backloaded on the cystoscope and a 6 x 26 double-J ureteral stent was advanced over the wire using Seldinger technique and positioned appropriately under fluoroscopic and cystoscopic guidance.  The wire was removed with a good curl noted in the renal pelvis as well as within the bladder.  The patient tolerated the procedure well without complications.  He was able to be extubated and transferred to the recovery unit in satisfactory condition.

## 2018-02-20 NOTE — Discharge Instructions (Signed)

## 2018-02-20 NOTE — Anesthesia Preprocedure Evaluation (Signed)
Anesthesia Evaluation  Patient identified by MRN, date of birth, ID band Patient awake    Reviewed: Allergy & Precautions, NPO status , Patient's Chart, lab work & pertinent test results  Airway Mallampati: II  TM Distance: >3 FB Neck ROM: Full    Dental no notable dental hx. (+) Teeth Intact, Dental Advisory Given   Pulmonary neg pulmonary ROS,    Pulmonary exam normal breath sounds clear to auscultation       Cardiovascular negative cardio ROS Normal cardiovascular exam Rhythm:Regular Rate:Normal     Neuro/Psych negative neurological ROS  negative psych ROS   GI/Hepatic negative GI ROS, Neg liver ROS,   Endo/Other  negative endocrine ROS  Renal/GU negative Renal ROS  negative genitourinary   Musculoskeletal negative musculoskeletal ROS (+)   Abdominal   Peds negative pediatric ROS (+)  Hematology negative hematology ROS (+)   Anesthesia Other Findings   Reproductive/Obstetrics negative OB ROS                             Lab Results  Component Value Date   WBC 7.9 02/19/2018   HGB 15.3 02/19/2018   HCT 42.9 02/19/2018   MCV 83.6 02/19/2018   PLT 164 02/19/2018    Anesthesia Physical Anesthesia Plan  ASA: II  Anesthesia Plan: General   Post-op Pain Management:    Induction: Intravenous  PONV Risk Score and Plan: Treatment may vary due to age or medical condition  Airway Management Planned: LMA  Additional Equipment:   Intra-op Plan:   Post-operative Plan:   Informed Consent: I have reviewed the patients History and Physical, chart, labs and discussed the procedure including the risks, benefits and alternatives for the proposed anesthesia with the patient or authorized representative who has indicated his/her understanding and acceptance.   Dental advisory given  Plan Discussed with: CRNA  Anesthesia Plan Comments:         Anesthesia Quick Evaluation

## 2018-02-20 NOTE — H&P (Signed)
Post-Operative Visit Report     02/20/2018   --------------------------------------------------------------------------------   Duane Gregory  MRN: [id number]  PRIMARY CARE:    DOB: 1960-12-12, 57 year old Male  REFERRING:  Alona Bene, MD  SSN:   PROVIDER:  Rhoderick Moody, M.D.    TREATING:  Jetta Lout    LOCATION:  [facility] [phone]   --------------------------------------------------------------------------------   CC: I am here for a post operative visit.  HPI: Duane Gregory is a 57 year-old male established patient who is here for a post operative visit.  Had ESWL 1 week ago. Now with continual intermittent flank pain. Was seen in the ER last pm for abdominal pain. Has had several days of poor po intake and some diarrhea.    The surgery he had done was (L) ESWL.   He has had post operative nausea. He has had vomiting. He has not had post operative fever. He has had post operative chills. He does not have a good appetite. BOWEL HABITS: his bowels are not moving normally.     ALLERGIES: None   MEDICATIONS: Fish Oil  Flonase Allergy Relief  Hydrocodone-Acetaminophen 5 mg-325 mg tablet  Metoclopramide Hcl 10 mg tablet  Multiple Vitamin     GU PSH: ESWL, Left - 02/14/2018    NON-GU PSH: Colonoscopy Knee Arthroscopy    GU PMH: Renal and ureteral calculus, Left, 1.3 cm left UPJ and 4.8 mm left lower pole renal stones - 02/11/2018    NON-GU PMH: GERD Skin Cancer, History    FAMILY HISTORY: Cancer - Sister Gout - Brother Heart trouble - Father   SOCIAL HISTORY: Marital Status: Single Preferred Language: English; Ethnicity: Not Hispanic Or Latino; Race: White Current Smoking Status: Patient has never smoked.   Tobacco Use Assessment Completed: Used Tobacco in last 30 days? Does not use smokeless tobacco. Has never drank.  Does not use drugs. Drinks 2 caffeinated drinks per day. Has not had a blood transfusion.    REVIEW OF  SYSTEMS:    GU Review Male:   Patient denies frequent urination, hard to postpone urination, burning/ pain with urination, get up at night to urinate, leakage of urine, stream starts and stops, trouble starting your stream, have to strain to urinate , erection problems, and penile pain.  Gastrointestinal (Upper):   Patient reports nausea and vomiting. Patient denies indigestion/ heartburn.  Gastrointestinal (Lower):   Patient reports diarrhea. Patient denies constipation.  Constitutional:   Patient denies fever, night sweats, weight loss, and fatigue.  Skin:   Patient denies skin rash/ lesion and itching.  Eyes:   Patient denies blurred vision and double vision.  Ears/ Nose/ Throat:   Patient denies sore throat and sinus problems.  Hematologic/Lymphatic:   Patient denies swollen glands and easy bruising.  Cardiovascular:   Patient denies leg swelling and chest pains.  Respiratory:   Patient denies cough and shortness of breath.  Endocrine:   Patient denies excessive thirst.  Musculoskeletal:   Patient reports back pain. Patient denies joint pain.  Neurological:   Patient denies dizziness and headaches.  Psychologic:   Patient denies depression and anxiety.   VITAL SIGNS:      02/20/2018 11:30 AM  Weight 227 lb / 102.97 kg  Height 72 in / 182.88 cm  BP 142/89 mmHg  Pulse 83 /min  Temperature 98.1 F / 36.7 C  BMI 30.8 kg/m   MULTI-SYSTEM PHYSICAL EXAMINATION:    Constitutional: Well-nourished. No physical deformities. Normally developed. Good grooming.  Lying on exam table.   Respiratory: Normal breath sounds. No labored breathing, no use of accessory muscles.   Cardiovascular: Regular rate and rhythm. No murmur, no gallop. Normal temperature, normal extremity pulses, no swelling, no varicosities.   Skin: Skin pale. No jaundice, no cyanosis. No lesion, no ulcer, no rash.   Neurologic / Psychiatric: Oriented to time, oriented to place, oriented to person. No depression, no anxiety, no  agitation.   Gastrointestinal: No mass, no tenderness, no rigidity, non obese abdomen. (+) L CVAT  Musculoskeletal: Normal gait and station of head and neck.     PAST DATA REVIEWED:  Source Of History:  Patient, Family/Caregiver  Records Review:   Previous Hospital Records, Previous Patient Records  Urine Test Review:   Urinalysis   PROCEDURES:         KUB - 16109  A single view of the abdomen is obtained.      Compared to last night's KUB it appears that stone remains at (L) L4 but now 2-3 fragments vs 1 large fragment. Also it appears he may also have several small stones distally ([place]) near UVJ.          Urinalysis - 81003 Dipstick Dipstick Cont'd Micro  Specimen: Voided Bilirubin: Neg WBC/hpf: 6 - 10/hpf  Color: Yellow Ketones: 2+ RBC/hpf: 0 - 2/hpf  Appearance: Clear Blood: 2+ Bacteria: Rare (0-9/hpf)  Specific Gravity: 1.020 Protein: Trace Cystals: NS (Not Seen)  pH: <=5.0 Urobilinogen: 0.2 Casts: NS (Not Seen)  Glucose: Neg Nitrites: Neg Trichomonas: Not Present    Leukocyte Esterase: Trace Mucous: Present      Epithelial Cells: 0 - 5/hpf      Yeast: NS (Not Seen)      Sperm: Not Present         Ketoralac  - [id number], [id number] Qty: 60 Adm. By: Anselm Pancoast  Unit: mg Lot No [id number]  Route: IM Exp. Date 08/17/2019  Freq: None Mfgr.:   Site: Left Buttock         Phenergan  - [id number], [id number] Qty: 25 Adm. By: Anselm Pancoast  Unit: mg Lot No [id number]  Route: IM Exp. Date 03/16/2018  Freq: None Mfgr.:   Site: Right Buttock   ASSESSMENT:      ICD-10 Details  1 GU:   Renal and ureteral calculus - N20.2 Left, Worsening - Ketorolac 60 mg IM today. Proceed with urgent cystourethroscopy, (L) RPG, stone extraction, and possible double J stent placement.   2 NON-GU:   Nausea - R11.0 Acute - Promethazine 25 mg IM today   PLAN:           Orders X-Rays: KUB          Schedule Procedure: 02/20/2018 at [facility] - [phone] - Ketoralac  (Toradol  Per 15 Mg) - [id number], [phone]  Procedure: 02/20/2018 at [facility] - [phone] - Phenergan  (Phenergan Per 50 Mg) - [id number], [id number]          Document Letter(s):  Created for Patient: Clinical Summary         Notes:   Discussed pt with Dr. Laverle Patter and he agrees that urgent surgical intervention is recommended at this time.   For ureteroscopy I described the risks which include heart attack, stroke, pulmonary embolus, death, bleeding, infection, damage to contiguous structures, positioning injury, ureteral stricture, ureteral avulsion, ureteral injury, need for ureteral stent, inability to perform ureteroscopy, need for an interval procedure, inability to clear stone burden, stent discomfort and pain.  He voices understanding and wishes to proceed.     E & M CODE: I spent at least 35 minutes face to face with the patient, more than 50% of that time was spent on counseling and/or coordinating care.     * Signed by Jetta Lout on 02/20/18 at 12:54 PM (EDT*

## 2018-02-20 NOTE — Anesthesia Postprocedure Evaluation (Signed)
Anesthesia Post Note  Patient: Agustin Swatek  Procedure(s) Performed: CYSTOSCOPY/RETROGRADE/URETEROSCOPY/STONE EXTRACTION WITH BASKET LEFT/LASER AND  STENT PLACEMENT (Left ) HOLMIUM LASER APPLICATION (N/A )     Patient location during evaluation: PACU Anesthesia Type: General Level of consciousness: awake and alert Pain management: pain level controlled Vital Signs Assessment: post-procedure vital signs reviewed and stable Respiratory status: spontaneous breathing, nonlabored ventilation, respiratory function stable and patient connected to nasal cannula oxygen Cardiovascular status: blood pressure returned to baseline and stable Postop Assessment: no apparent nausea or vomiting Anesthetic complications: no    Last Vitals:  Vitals:   02/20/18 1930 02/20/18 1954  BP: (!) 148/101 (!) 150/97  Pulse: 79 72  Resp: 20   Temp: 36.8 C 37 C  SpO2: 94% 96%    Last Pain:  Vitals:   02/20/18 1954  TempSrc:   PainSc: 0-No pain                 Trevor Iha

## 2018-02-20 NOTE — Anesthesia Procedure Notes (Signed)
Procedure Name: LMA Insertion Date/Time: 02/20/2018 6:01 PM Performed by: Epimenio Sarin, CRNA Pre-anesthesia Checklist: Patient identified, Emergency Drugs available, Suction available, Patient being monitored and Timeout performed Patient Re-evaluated:Patient Re-evaluated prior to induction Oxygen Delivery Method: Circle system utilized Preoxygenation: Pre-oxygenation with 100% oxygen Induction Type: IV induction LMA: LMA with gastric port inserted LMA Size: 4.0 Number of attempts: 1 Dental Injury: Teeth and Oropharynx as per pre-operative assessment

## 2018-02-21 ENCOUNTER — Encounter (HOSPITAL_COMMUNITY): Payer: Self-pay | Admitting: Urology

## 2018-03-24 ENCOUNTER — Other Ambulatory Visit: Payer: Self-pay

## 2018-03-24 ENCOUNTER — Inpatient Hospital Stay (HOSPITAL_BASED_OUTPATIENT_CLINIC_OR_DEPARTMENT_OTHER)
Admission: EM | Admit: 2018-03-24 | Discharge: 2018-03-27 | DRG: 176 | Disposition: A | Discharge status: Home / Self Care | Payer: BLUE CROSS/BLUE SHIELD | Attending: Family Medicine | Admitting: Family Medicine

## 2018-03-24 ENCOUNTER — Emergency Department (HOSPITAL_BASED_OUTPATIENT_CLINIC_OR_DEPARTMENT_OTHER): Payer: BLUE CROSS/BLUE SHIELD

## 2018-03-24 ENCOUNTER — Encounter (HOSPITAL_BASED_OUTPATIENT_CLINIC_OR_DEPARTMENT_OTHER): Payer: Self-pay | Admitting: Emergency Medicine

## 2018-03-24 DIAGNOSIS — Z87442 Personal history of urinary calculi: Secondary | ICD-10-CM | POA: Diagnosis not present

## 2018-03-24 DIAGNOSIS — N182 Chronic kidney disease, stage 2 (mild): Secondary | ICD-10-CM | POA: Diagnosis present

## 2018-03-24 DIAGNOSIS — I2692 Saddle embolus of pulmonary artery without acute cor pulmonale: Secondary | ICD-10-CM | POA: Diagnosis present

## 2018-03-24 DIAGNOSIS — Z79899 Other long term (current) drug therapy: Secondary | ICD-10-CM | POA: Diagnosis not present

## 2018-03-24 DIAGNOSIS — R7989 Other specified abnormal findings of blood chemistry: Secondary | ICD-10-CM | POA: Diagnosis present

## 2018-03-24 DIAGNOSIS — I50811 Acute right heart failure: Secondary | ICD-10-CM | POA: Diagnosis not present

## 2018-03-24 DIAGNOSIS — R008 Other abnormalities of heart beat: Secondary | ICD-10-CM | POA: Diagnosis present

## 2018-03-24 DIAGNOSIS — Z6832 Body mass index (BMI) 32.0-32.9, adult: Secondary | ICD-10-CM | POA: Diagnosis not present

## 2018-03-24 DIAGNOSIS — N179 Acute kidney failure, unspecified: Secondary | ICD-10-CM | POA: Diagnosis present

## 2018-03-24 DIAGNOSIS — R911 Solitary pulmonary nodule: Secondary | ICD-10-CM | POA: Diagnosis present

## 2018-03-24 DIAGNOSIS — I361 Nonrheumatic tricuspid (valve) insufficiency: Secondary | ICD-10-CM | POA: Diagnosis not present

## 2018-03-24 DIAGNOSIS — R0600 Dyspnea, unspecified: Secondary | ICD-10-CM | POA: Diagnosis not present

## 2018-03-24 DIAGNOSIS — I2602 Saddle embolus of pulmonary artery with acute cor pulmonale: Principal | ICD-10-CM | POA: Diagnosis present

## 2018-03-24 DIAGNOSIS — E669 Obesity, unspecified: Secondary | ICD-10-CM | POA: Diagnosis not present

## 2018-03-24 LAB — CBC
HCT: 47.1 % (ref 39.0–52.0)
HEMOGLOBIN: 15.8 g/dL (ref 13.0–17.0)
MCH: 28.5 pg (ref 26.0–34.0)
MCHC: 33.5 g/dL (ref 30.0–36.0)
MCV: 84.9 fL (ref 78.0–100.0)
Platelets: 178 10*3/uL (ref 150–400)
RBC: 5.55 MIL/uL (ref 4.22–5.81)
RDW: 15 % (ref 11.5–15.5)
WBC: 6.5 10*3/uL (ref 4.0–10.5)

## 2018-03-24 LAB — COMPREHENSIVE METABOLIC PANEL
ALBUMIN: 4.3 g/dL (ref 3.5–5.0)
ALK PHOS: 86 U/L (ref 38–126)
ALT: 89 U/L — ABNORMAL HIGH (ref 17–63)
AST: 91 U/L — ABNORMAL HIGH (ref 15–41)
Anion gap: 10 (ref 5–15)
BILIRUBIN TOTAL: 0.7 mg/dL (ref 0.3–1.2)
BUN: 18 mg/dL (ref 6–20)
CALCIUM: 9.3 mg/dL (ref 8.9–10.3)
CO2: 26 mmol/L (ref 22–32)
Chloride: 107 mmol/L (ref 101–111)
Creatinine, Ser: 1.35 mg/dL — ABNORMAL HIGH (ref 0.61–1.24)
GFR calc Af Amer: >60 mL/min (ref >60)
GFR, EST NON AFRICAN AMERICAN: 56 mL/min — AB (ref >60)
GLUCOSE: 160 mg/dL — AB (ref 65–99)
Potassium: 4 mmol/L (ref 3.5–5.1)
Sodium: 143 mmol/L (ref 135–145)
TOTAL PROTEIN: 7.9 g/dL (ref 6.5–8.1)

## 2018-03-24 LAB — MRSA PCR SCREENING: MRSA by PCR: NEGATIVE

## 2018-03-24 LAB — HEPARIN LEVEL (UNFRACTIONATED): HEPARIN UNFRACTIONATED: 0.53 [IU]/mL (ref 0.30–0.70)

## 2018-03-24 LAB — TROPONIN I: Troponin I: 0.04 ng/mL (ref <0.03)

## 2018-03-24 LAB — PROTIME-INR
INR: 0.96
PROTHROMBIN TIME: 12.7 s (ref 11.4–15.2)

## 2018-03-24 LAB — MAGNESIUM: Magnesium: 1.9 mg/dL (ref 1.7–2.4)

## 2018-03-24 LAB — LIPASE, BLOOD: LIPASE: 46 U/L (ref 11–51)

## 2018-03-24 MED ORDER — ONDANSETRON HCL 4 MG PO TABS: 4.0000 mg | ORAL_TABLET | Freq: Four times a day (QID) | ORAL | Status: DC | PRN

## 2018-03-24 MED ORDER — HEPARIN (PORCINE) IN NACL 100-0.45 UNIT/ML-% IJ SOLN
12.0000 [IU]/kg/h | Freq: Once | INTRAMUSCULAR | Status: DC
  Administered 2018-03-24: 12 [IU]/kg/h via INTRAVENOUS
  Filled 2018-03-24: qty 250

## 2018-03-24 MED ORDER — SODIUM CHLORIDE 0.9 % IV BOLUS
1000.0000 mL | Freq: Once | INTRAVENOUS | Status: AC
Start: 2018-03-24 — End: 2018-03-24
  Administered 2018-03-24: 1000 mL via INTRAVENOUS

## 2018-03-24 MED ORDER — HEPARIN (PORCINE) IN NACL 100-0.45 UNIT/ML-% IJ SOLN
1650.0000 [IU]/h | INTRAMUSCULAR | Status: DC
  Administered 2018-03-24: 1550 [IU]/h via INTRAVENOUS
  Administered 2018-03-25: 1600 [IU]/h via INTRAVENOUS
  Administered 2018-03-25: 1550 [IU]/h via INTRAVENOUS
  Administered 2018-03-26 – 2018-03-27 (×2): 1650 [IU]/h via INTRAVENOUS
  Filled 2018-03-24 (×4): qty 250

## 2018-03-24 MED ORDER — TRAMADOL HCL 50 MG PO TABS
50.0000 mg | ORAL_TABLET | Freq: Two times a day (BID) | ORAL | Status: DC | PRN
  Administered 2018-03-24: 50 mg via ORAL
  Filled 2018-03-24: qty 1

## 2018-03-24 MED ORDER — HEPARIN SODIUM (PORCINE) 5000 UNIT/ML IJ SOLN: 4000.0000 [IU] | Freq: Once | INTRAMUSCULAR | Status: DC

## 2018-03-24 MED ORDER — GUAIFENESIN-DM 100-10 MG/5ML PO SYRP
5.0000 mL | ORAL_SOLUTION | ORAL | Status: DC | PRN
Start: 2018-03-24 — End: 2018-03-27
  Administered 2018-03-24 – 2018-03-25 (×2): 5 mL via ORAL
  Filled 2018-03-24 (×2): qty 5

## 2018-03-24 MED ORDER — ONDANSETRON HCL 4 MG/2ML IJ SOLN: 4.0000 mg | Freq: Four times a day (QID) | INTRAMUSCULAR | Status: DC | PRN

## 2018-03-24 MED ORDER — IOPAMIDOL (ISOVUE-370) INJECTION 76%
100.0000 mL | Freq: Once | INTRAVENOUS | Status: AC | PRN
  Administered 2018-03-24: 81 mL via INTRAVENOUS

## 2018-03-24 MED ORDER — HEPARIN BOLUS VIA INFUSION
5000.0000 [IU] | Freq: Once | INTRAVENOUS | Status: AC
  Administered 2018-03-24: 5000 [IU] via INTRAVENOUS

## 2018-03-24 MED ORDER — SODIUM CHLORIDE 0.9% FLUSH
3.0000 mL | Freq: Two times a day (BID) | INTRAVENOUS | Status: DC
Start: 2018-03-24 — End: 2018-03-27

## 2018-03-24 NOTE — H&P (Signed)
History and Physical   Duane Gregory WUJ:[id number] DOB: [birth year] DOA: 03/24/2018  PCP: Richmond CampbellKaplan, Kristen W., PA-C  Chief Complaint: shortness of breath  HPI: This is a 57 year old man with medical problems including obesity and nephrolithiasis who presented with shortness of breath.  History is obtained via review of the medical record and discussion with the patient.  In May 2019 the patient developed symptoms prompting a workup that found nephrolithiasis that was treated initially with lithotripsy per patient report that was unsuccessful thereby warranting ureteral stent placement that has subsequently been removed. The patient reports shortness time he had 15 pounds of weight loss that he relates to decreased by mouth food and drink related to his symptoms of nephrolithiasis causing abdominal pain and nausea and vomiting.  Over the course of the past week he has developed exertional dyspnea. He works as a Psychologist, occupationalbanker and takes a walk for 30 minutes on his lunch break. He noticed over the course of the past week having more dyspnea on exertion, he thought it may have been related to pollen. On the day of presentation, he had worsening dyspnea on exertion prompting him to seek medical care. Associated symptoms include dizziness,as well as epigastric discomfort.  At baseline, the patient is a nonsmoker, no daily alcohol use. Works as a Psychologist, occupationalbanker. Lives at home with his children.  ED Course: in the emergency department vital signs remarkable for systolic blood pressure 150s, tachycardic to the 130s, restaurant rate elevated to 23 that has normalized.  Workup included a chest x-ray as well as CT PE study which revealed acute large clot burden bilateral palmar embolism with saddle embolus, right heart strain noted, at least submassive pulmonary embolus. Also noted was a 9 mm posterior left upper lobe pulmonary nodule. Patient was started on a heparin drip as well as given 1 L of isotonic fluid. EKG revealed  ventricular bigeminy. CBC unremarkable.  CMP remarkable for AST of 91, LT of 89. Troponin elevated to 0.04.  The case was discussed with critical care medicine, Dr. Wallace CullensGray who agreed with continuing anticoagulation and no indication for lytic therapy this time.  Review of Systems: A complete ROS was obtained; pertinent positives negatives are denoted in the HPI. Otherwise, all systems are negative.   Past Medical History:  Diagnosis Date  . History of kidney stones    Social History   Socioeconomic History  . Marital status: Legally Separated    Spouse name: Not on file  . Number of children: Not on file  . Years of education: Not on file  . Highest education level: Not on file  Occupational History  . Not on file  Social Needs  . Financial resource strain: Not on file  . Food insecurity:    Worry: Not on file    Inability: Not on file  . Transportation needs:    Medical: Not on file    Non-medical: Not on file  Tobacco Use  . Smoking status: Never Smoker  . Smokeless tobacco: Never Used  Substance and Sexual Activity  . Alcohol use: Yes    Comment: occ  . Drug use: Never  . Sexual activity: Not on file  Lifestyle  . Physical activity:    Days per week: Not on file    Minutes per session: Not on file  . Stress: Not on file  Relationships  . Social connections:    Talks on phone: Not on file    Gets together: Not on file    Attends religious  service: Not on file    Active member of club or organization: Not on file    Attends meetings of clubs or organizations: Not on file    Relationship status: Not on file  . Intimate partner violence:    Fear of current or ex partner: Not on file    Emotionally abused: Not on file    Physically abused: Not on file    Forced sexual activity: Not on file  Other Topics Concern  . Not on file  Social History Narrative  . Not on file   Family hx: Sister With history of  VTE, this was in the antepartum period. Father had heart  disease.  Physical Exam: Vitals:   03/24/18 1630 03/24/18 1645 03/24/18 1700 03/24/18 1927  BP: 138/83 (!) 130/93 132/90 (!) 156/88  Pulse: 78 62 (!) 59 98  Resp: 20 14 14 20   Temp:    98.3 F (36.8 C)  TempSrc:    Oral  SpO2: 94% 95% 94% 97%  Weight:      Height:       General: Appears calm and comfortable, obese white man. ENT: Grossly normal hearing, MMM. Cardiovascular: Tachycardic. No M/R/G. No LE edema. No JVD appreciated. Respiratory: CTA bilaterally. No wheezes or crackles. Normal respiratory effort.  2 L [place] O2. Abdomen: Soft, non-tender. No rebound or guarding. Skin: No rash or induration seen on limited exam. Musculoskeletal: Grossly normal tone BUE/BLE. Appropriate ROM.  Psychiatric: Grossly normal mood and affect. Neurologic: Moves all extremities in coordinated fashion.  I have personally reviewed the following labs, culture data, and imaging studies.  Assessment/Plan:  #Submassive / intermediate risk saddle pulmonary embolus Presented with progressive DOE for past week that acutely worsened. CT PE study found large saddle embolus with evidence of RV strain.  Provoking factor includes recent urologic procedures (lithotriopsy, ureteral stent placement and removal) in 02/2018.  No personal hx of VTE, sister with reported FVL and VTE antepartum period.  Denies immobility or trauma.  Currently tachycardic, but with SBP > 140.  No LE edema or pain upon calf palpation to suggest associated DVT at this time. Plan: Will continue therapeutic heparin gtt; I discussed the case with the bedside nurse as well as our pharmacy team who is assisting with optimizing hep gtt dose for ensuring ongoing therapeutic AC. TTE ordered to evaluate RV and cardiac function.  If clinical course worsens, will engage critical care team for consideration of lytic therapy.  Continue telemetry.  Longer term anticoagulant of choice to be decided as clinical course progresses.  #Other problems -Obesity:  BMI noted just above 30 -Pulmonary nodule, 9 mm posterior LUL: recommend repeat CT scan in 3-6 months to ensure persistence -Elevated troponin: likely a result of the PE -Ventricular bigeminy: due to PE, continue telemetry  DVT prophylaxis: on systemic AC Code Status: full code Disposition Plan: Anticipate D/C home in 2-5 days Consults called: pulmonary consult called prior to patient arrival Admission status:  Admit to hospital medicine, step down unit.   Laurell Roof, MD [facility] [phone]  If 7PM-7AM, please contact night-coverage [email or web address] Password [email or web address]

## 2018-03-24 NOTE — ED Notes (Signed)
ED Provider at bedside. 

## 2018-03-24 NOTE — ED Notes (Signed)
Date and time results received: 03/24/18 2:12 PM  (use smartphrase ".now" to insert current time)  Test: Troponin Critical Value:0.04  Name of Provider Notified: Dr. Lynelle DoctorKnapp  Orders Received? Or Actions Taken?:

## 2018-03-24 NOTE — ED Notes (Signed)
Cardiac rhythm is between bigeminy and ST.  Denies pain but voiced discomfort to epigastric area.

## 2018-03-24 NOTE — Progress Notes (Signed)
Hospitalist Transfer Accept Note [name] is a 57 y.o. M previously healthy who presents with 4-5 days intermittent SOB, epigastric or chest pressure, now worse.  ED course: -HR 133, RR 22, BP 134/105 mmHg, pulse ox WNL -Na 143, Cr 1.3 (baseline 1.3), AST and ALT slightly elevated -CXR clear -ECG ventricular bigeminy, tachycardic -CTA showed large clot burden bilateral pulmonary embolism with saddle embolus. Positive for acute PE with CT evidence of right heart strain (RV/LV Ratio = 1.3)   -Incidental lung nodules -Troponin 0.04   -Heparin bolused -Case discussed with Dr. Wallace CullensGray, CCM; patient tachycardic, but BP stable and mentation good; no indications for lytic therapy at this time -Transfer to Step down bed, continue heparin

## 2018-03-24 NOTE — Progress Notes (Signed)
ANTICOAGULATION CONSULT NOTE - Initial Consult  Pharmacy Consult for heparin Indication: pulmonary embolus  No Known Allergies  Patient Measurements: Height: 6' (182.9 cm) Weight: 237 lb (107.5 kg) IBW/kg (Calculated) : 77.6 Heparin Dosing Weight: 99 kg   Vital Signs: BP: 115/91 (06/09 1345) Pulse Rate: 60 (06/09 1345)  Labs: Recent Labs    03/24/18 1249  HGB 15.8  HCT 47.1  PLT 178  LABPROT 12.7  INR 0.96  CREATININE 1.35*  TROPONINI 0.04*    Estimated Creatinine Clearance: 75.6 mL/min (A) (by C-G formula based on SCr of 1.35 mg/dL (H)).   Medical History: Past Medical History:  Diagnosis Date  . History of kidney stones     Medications:   (Not in a hospital admission)  Assessment: [age] YOM who presents to the ED with shortness of breath and chest discomfort. CT angio shows bilateral PE with saddle embolus and large clot burden and right heart strain. H/H and Plt wnl. Troponin mildly elevated. Patient is not on any anticoagulation prior to admission   Goal of Therapy:  Heparin level 0.3-0.7 units/ml Monitor platelets by anticoagulation protocol: Yes   Plan:  -Heparin 5000 units IV bolus, then start IV heparin infusion at 1550 units/hr -F/u 6 hr HL -Monitor daily HL, CBC and s/s of bleeding   Vinnie LevelBenjamin Tanicka Bisaillon, PharmD., BCPS Clinical Pharmacist Clinical phone for 03/24/18 until 3:30pm: [phone] If after 3:30pm, please call main pharmacy at: [phone]

## 2018-03-24 NOTE — Progress Notes (Signed)
ANTICOAGULATION CONSULT NOTE   Pharmacy Consult for heparin Indication: pulmonary embolus  No Known Allergies  Patient Measurements: Height: 6' (182.9 cm) Weight: 237 lb (107.5 kg) IBW/kg (Calculated) : 77.6 Heparin Dosing Weight: 99 kg   Vital Signs: Temp: 98.3 F (36.8 C) (06/09 1927) Temp Source: Oral (06/09 1927) BP: 156/88 (06/09 1927) Pulse Rate: 98 (06/09 1927)  Labs: Recent Labs    03/24/18 1249 03/24/18 2132  HGB 15.8  --   HCT 47.1  --   PLT 178  --   LABPROT 12.7  --   INR 0.96  --   HEPARINUNFRC  --  0.53  CREATININE 1.35*  --   TROPONINI 0.04*  --     Estimated Creatinine Clearance: 75.6 mL/min (A) (by C-G formula based on SCr of 1.35 mg/dL (H)).   Medical History: Past Medical History:  Diagnosis Date  . History of kidney stones     Medications:  Medications Prior to Admission  Medication Sig Dispense Refill Last Dose  . HYDROcodone-acetaminophen (NORCO/VICODIN) 5-325 MG tablet Take 1 tablet by mouth every 6 (six) hours as needed for severe pain. 12 tablet 0 02/20/2018 at 1000  . HYDROcodone-acetaminophen (NORCO/VICODIN) 5-325 MG tablet Take 1-2 tablets by mouth every 6 (six) hours as needed. 25 tablet 0   . ketorolac (TORADOL) 10 MG tablet Take 1 tablet (10 mg total) by mouth every 6 (six) hours as needed. 20 tablet 0 Past Week at Unknown time  . metoCLOPramide (REGLAN) 10 MG tablet Take 1 tablet (10 mg total) by mouth every 6 (six) hours. 30 tablet 0 02/20/2018 at 0600  . ondansetron (ZOFRAN) 4 MG tablet Take 1 tablet (4 mg total) by mouth daily as needed for nausea or vomiting. 12 tablet 0 unknown  . phenazopyridine (PYRIDIUM) 200 MG tablet Take 1 tablet (200 mg total) by mouth 3 (three) times daily as needed for pain. 10 tablet 0     Assessment: Duane Gregory who presents to the ED with shortness of breath and chest discomfort. CT angio shows bilateral PE with saddle embolus and large clot burden and right heart strain. H/H and Plt wnl. Troponin mildly  elevated. Patient is not on any anticoagulation prior to admission.  Initial heparin level came back therapeutic at 0.53, on 1550 units/hr. Hgb 15.8, plt 178. No s/sx of bleeding. No infusion issues.   Goal of Therapy:  Heparin level 0.3-0.7 units/ml Monitor platelets by anticoagulation protocol: Yes   Plan:  -Continue IV heparin infusion at 1550 units/hr -F/u 6 hr confirmatory HL -Monitor daily HL, CBC and s/s of bleeding   Girard CooterKimberly Perkins, PharmD Clinical Pharmacist  Pager: [phone] Phone: [phone]

## 2018-03-24 NOTE — Progress Notes (Signed)
[facility]  Placed on SD monitor and CHG bath completed. Skin assessment completed and WNL.  MRSA swab completed and sent.

## 2018-03-24 NOTE — ED Provider Notes (Signed)
[facility] EMERGENCY DEPARTMENT Provider Note   CSN: [id number] Arrival date & time: 03/24/18  1238     History   Chief Complaint Chief Complaint  Patient presents with  . Tachycardia    HPI Duane Gregory is a 57 y.o. male.  HPI Patient presents to the emergency room for evaluation of shortness of breath and some chest discomfort.  Patient states he started having symptoms intermittently since Wednesday.  He has noticed that he is getting somewhat short of breath with activity.  He had some mild midepigastric pressure intermittently and is felt diaphoretic but denies any chest pain.  He is not currently having any abdominal pain.  Patient denies any history of heart disease or lung disease.  No history of PE or DVT but there is family history of that.  Patient also states he recently had surgery for urologic procedure. Past Medical History:  Diagnosis Date  . History of kidney stones     Patient Active Problem List   Diagnosis Date Noted  . Acute saddle pulmonary embolism with acute cor pulmonale (HCC) 03/24/2018    Past Surgical History:  Procedure Laterality Date  . CYSTOSCOPY/RETROGRADE/URETEROSCOPY/STONE EXTRACTION WITH BASKET Left 02/20/2018   Procedure: CYSTOSCOPY/RETROGRADE/URETEROSCOPY/STONE EXTRACTION WITH BASKET LEFT/LASER AND  STENT PLACEMENT;  Surgeon: Heloise Purpura, MD;  Location: [facility];  Service: Urology;  Laterality: Left;  . EXTRACORPOREAL SHOCK WAVE LITHOTRIPSY Left 02/14/2018   Procedure: LEFT EXTRACORPOREAL SHOCK WAVE LITHOTRIPSY (ESWL);  Surgeon: Jerilee Field, MD;  Location: [facility];  Service: Urology;  Laterality: Left;  . HOLMIUM LASER APPLICATION N/A 02/20/2018   Procedure: HOLMIUM LASER APPLICATION;  Surgeon: Heloise Purpura, MD;  Location: [facility];  Service: Urology;  Laterality: N/A;  . KNEE SURGERY          Home Medications    Prior to Admission medications   Medication Sig Start Date End Date Taking? Authorizing Provider    HYDROcodone-acetaminophen (NORCO/VICODIN) 5-325 MG tablet Take 1 tablet by mouth every 6 (six) hours as needed for severe pain. 02/08/18   Long, Arlyss Repress, MD  HYDROcodone-acetaminophen (NORCO/VICODIN) 5-325 MG tablet Take 1-2 tablets by mouth every 6 (six) hours as needed. 02/20/18   Heloise Purpura, MD  ketorolac (TORADOL) 10 MG tablet Take 1 tablet (10 mg total) by mouth every 6 (six) hours as needed. 02/16/18   Jerilee Field, MD  metoCLOPramide (REGLAN) 10 MG tablet Take 1 tablet (10 mg total) by mouth every 6 (six) hours. 02/20/18   Nira Conn, MD  ondansetron (ZOFRAN) 4 MG tablet Take 1 tablet (4 mg total) by mouth daily as needed for nausea or vomiting. 02/14/18 02/14/19  Jerilee Field, MD  phenazopyridine (PYRIDIUM) 200 MG tablet Take 1 tablet (200 mg total) by mouth 3 (three) times daily as needed for pain. 02/20/18   Heloise Purpura, MD    Family History History reviewed. No pertinent family history.  Social History Social History   Tobacco Use  . Smoking status: Never Smoker  . Smokeless tobacco: Never Used  Substance Use Topics  . Alcohol use: Yes    Comment: occ  . Drug use: Never     Allergies   Patient has no known allergies.   Review of Systems Review of Systems  Constitutional: Negative for fever.  Respiratory: Negative for cough.   Gastrointestinal: Negative for constipation, diarrhea and vomiting.  Genitourinary: Negative for dysuria.  All other systems reviewed and are negative.    Physical Exam Updated Vital Signs BP (!) 126/104  Pulse (!) 119   Resp 19   Ht 1.829 m (6')   Wt 107.5 kg (237 lb)   SpO2 94%   BMI 32.14 kg/m   Physical Exam  Constitutional: He appears well-developed and well-nourished. No distress.  HENT:  Head: Normocephalic and atraumatic.  Right Ear: External ear normal.  Left Ear: External ear normal.  Eyes: Conjunctivae are normal. Right eye exhibits no discharge. Left eye exhibits no discharge. No scleral icterus.   Neck: Neck supple. No tracheal deviation present.  Cardiovascular: Regular rhythm and intact distal pulses. Tachycardia present.  Pulmonary/Chest: Effort normal and breath sounds normal. No stridor. No respiratory distress. He has no wheezes. He has no rales.  Abdominal: Soft. Bowel sounds are normal. He exhibits no distension. There is no tenderness. There is no rebound and no guarding.  Musculoskeletal: He exhibits no edema or tenderness.  Neurological: He is alert. He has normal strength. No cranial nerve deficit (no facial droop, extraocular movements intact, no slurred speech) or sensory deficit. He exhibits normal muscle tone. He displays no seizure activity. Coordination normal.  Skin: Skin is warm and dry. No rash noted.  Psychiatric: He has a normal mood and affect.  Nursing note and vitals reviewed.    ED Treatments / Results  Labs (all labs ordered are listed, but only abnormal results are displayed) Labs Reviewed  TROPONIN I - Abnormal; Notable for the following components:      Result Value   Troponin I 0.04 (*)    All other components within normal limits  COMPREHENSIVE METABOLIC PANEL - Abnormal; Notable for the following components:   Glucose, Bld 160 (*)    Creatinine, Ser 1.35 (*)    AST 91 (*)    ALT 89 (*)    GFR calc non Af Amer 56 (*)    All other components within normal limits  CBC  PROTIME-INR  LIPASE, BLOOD  HEPARIN LEVEL (UNFRACTIONATED)  CBC  TROPONIN I  TROPONIN I  MAGNESIUM    EKG EKG Interpretation  Date/Time:  Sunday March 24 2018 12:44:49 EDT Ventricular Rate:  139 PR Interval:    QRS Duration: 92 QT Interval:  327 QTC Calculation: 433 R Axis:   94 Text Interpretation:  Sinus tachycardia Ventricular bigeminy Borderline right axis deviation Borderline repolarization abnormality No old tracing to compare Confirmed by Linwood Dibbles [phone]) on 03/24/2018 12:48:39 PM Also confirmed by Linwood Dibbles [id number]), editor Clayton, Tamera Punt ([id number])  on  03/24/2018 1:21:59 PM   Radiology Dg Chest 2 View  Result Date: 03/24/2018 CLINICAL DATA:  Dyspnea EXAM: CHEST - 2 VIEW COMPARISON:  02/19/2018 chest radiograph. FINDINGS: Stable cardiomediastinal silhouette with normal heart size. No pneumothorax. No pleural effusion. Lungs appear clear, with no acute consolidative airspace disease and no pulmonary edema. IMPRESSION: No active cardiopulmonary disease. Electronically Signed   By: Delbert Phenix M.D.   On: 03/24/2018 13:44   Ct Angio Chest Pe W And/or Wo Contrast  Result Date: 03/24/2018 CLINICAL DATA:  Dyspnea.  Epigastric pain.  Tachycardia. EXAM: CT ANGIOGRAPHY CHEST WITH CONTRAST TECHNIQUE: Multidetector CT imaging of the chest was performed using the standard protocol during bolus administration of intravenous contrast. Multiplanar CT image reconstructions and MIPs were obtained to evaluate the vascular anatomy. CONTRAST:  81mL ISOVUE-370 IOPAMIDOL (ISOVUE-370) INJECTION 76% COMPARISON:  Chest radiograph from earlier today. FINDINGS: Cardiovascular: The study is high quality for the evaluation of pulmonary embolism. Acute saddle pulmonary embolus is present. Acute large burden pulmonary emboli at the bifurcation of the left  and right pulmonary arteries and within the lobar, segmental and subsegmental pulmonary arteries of all lung lobes bilaterally. Atherosclerotic nonaneurysmal thoracic aorta. Dilated main pulmonary artery (3.6 cm diameter). Top-normal heart size. Elevated RV/LV ratio 1.3. No significant pericardial fluid/thickening. Mediastinum/Nodes: No discrete thyroid nodules. Unremarkable esophagus. No pathologically enlarged axillary, mediastinal or hilar lymph nodes. Lungs/Pleura: No pneumothorax. No pleural effusion. No acute consolidative airspace disease or lung masses. Sub solid 9 mm posterior left upper lobe nodule (series 5/image 19). No additional significant pulmonary nodules. Upper abdomen: Simple 1.2 cm inferior right liver lobe cyst.  Musculoskeletal:  No aggressive appearing focal osseous lesions. Review of the MIP images confirms the above findings. IMPRESSION: 1. Acute large clot burden bilateral pulmonary embolism with saddle embolus. Positive for acute PE with CT evidence of right heart strain (RV/LV Ratio = 1.3) consistent with at least submassive (intermediate risk) PE. The presence of right heart strain has been associated with an increased risk of morbidity and mortality. Please activate Code PE by paging [phone]. 2. Dilated main pulmonary artery, suggesting pulmonary arterial hypertension. 3. Subsolid 9 mm posterior left upper lobe pulmonary nodule. Follow-up non-contrast CT recommended at 3-6 months to confirm persistence. If unchanged, and solid component remains <6 mm, annual CT is recommended until 5 years of stability has been established. If persistent these nodules should be considered highly suspicious if the solid component of the nodule is 6 mm or greater in size and enlarging. This recommendation follows the consensus statement: Guidelines for Management of Incidental Pulmonary Nodules Detected on CT Images: From the Fleischner Society 2017; Radiology 2017; 284:228-243. Aortic Atherosclerosis (ICD10-I70.0). Critical Value/emergent results were called by telephone at the time of interpretation on 03/24/2018 at 2:41 pm to Dr. Linwood DibblesJON Bennetta Rudden , who verbally acknowledged these results. Electronically Signed   By: Delbert PhenixJason A Poff M.D.   On: 03/24/2018 14:49    Procedures .Critical Care Performed by: Linwood DibblesKnapp, Gabryelle Whitmoyer, MD Authorized by: Linwood DibblesKnapp, Guage Efferson, MD   Critical care provider statement:    Critical care time (minutes):  45   Critical care was time spent personally by me on the following activities:  Discussions with consultants, evaluation of patient's response to treatment, examination of patient, ordering and performing treatments and interventions, ordering and review of laboratory studies, ordering and review of radiographic  studies, pulse oximetry, re-evaluation of patient's condition, obtaining history from patient or surrogate and review of old charts   (including critical care time)  Medications Ordered in ED Medications  heparin ADULT infusion 100 units/mL (25000 units/23650mL sodium chloride 0.45%) (1,550 Units/hr Intravenous New Bag/Given 03/24/18 1441)  sodium chloride 0.9 % bolus 1,000 mL (0 mLs Intravenous Stopped 03/24/18 1431)  iopamidol (ISOVUE-370) 76 % injection 100 mL (81 mLs Intravenous Contrast Given 03/24/18 1415)  heparin bolus via infusion 5,000 Units (5,000 Units Intravenous Bolus from Bag 03/24/18 1441)     Initial Impression / Assessment and Plan / ED Course  I have reviewed the triage vital signs and the nursing notes.  Pertinent labs & imaging results that were available during my care of the patient were reviewed by me and considered in my medical decision making (see chart for details).  Clinical Course as of Mar 25 1515  Sun Mar 24, 2018  1444 Discussed with Dr Juanita CraverGrey, critical care.  Agrees with initial management.  Would benefit from Echo to see if he requires directed intervention   [JK]  1501 D/w Dr Maryfrances Bunnellanford.  [facility]   [JK]    Clinical Course User  Index [JK] Linwood Dibbles, MD    Patient presents to the emergency room for evaluation of shortness of breath.  Patient symptoms were concerning for pulmonary embolism.  CT scan confirms a saddle embolism.  Patient has remained hemodynamically stable.  Patient was started on IV heparin.  I discussed the case with Dr. Wallace Cullens and Dr. Maryfrances Bunnell.  Plan on transfer to [facility] for admission, further evaluation and treatment.  Final Clinical Impressions(s) / ED Diagnoses   Final diagnoses:  Acute saddle pulmonary embolism with acute cor pulmonale (HCC)      Linwood Dibbles, MD 03/24/18 [phone]

## 2018-03-24 NOTE — Progress Notes (Signed)
Pt having stress to heart even at rest is in ST in 120-130's and has bigeminy often.  With any movement, pt HR tachy to 140's.

## 2018-03-24 NOTE — ED Triage Notes (Signed)
Patient states that he has had SOB intermittently since wed. Worsening today and more noticeable. Patient with mid epigastric pressure and sweating

## 2018-03-25 ENCOUNTER — Other Ambulatory Visit: Payer: Self-pay

## 2018-03-25 ENCOUNTER — Inpatient Hospital Stay (HOSPITAL_COMMUNITY): Payer: BLUE CROSS/BLUE SHIELD

## 2018-03-25 DIAGNOSIS — E669 Obesity, unspecified: Secondary | ICD-10-CM

## 2018-03-25 DIAGNOSIS — I361 Nonrheumatic tricuspid (valve) insufficiency: Secondary | ICD-10-CM

## 2018-03-25 DIAGNOSIS — N179 Acute kidney failure, unspecified: Secondary | ICD-10-CM

## 2018-03-25 LAB — COMPREHENSIVE METABOLIC PANEL
ALK PHOS: 70 U/L (ref 38–126)
ALT: 75 U/L — AB (ref 17–63)
AST: 44 U/L — AB (ref 15–41)
Albumin: 3.5 g/dL (ref 3.5–5.0)
Anion gap: 8 (ref 5–15)
BUN: 12 mg/dL (ref 6–20)
CALCIUM: 8.9 mg/dL (ref 8.9–10.3)
CO2: 22 mmol/L (ref 22–32)
Chloride: 111 mmol/L (ref 101–111)
Creatinine, Ser: 1.25 mg/dL — ABNORMAL HIGH (ref 0.61–1.24)
GFR calc Af Amer: >60 mL/min (ref >60)
GFR calc non Af Amer: >60 mL/min (ref >60)
GLUCOSE: 135 mg/dL — AB (ref 65–99)
Potassium: 4.5 mmol/L (ref 3.5–5.1)
SODIUM: 141 mmol/L (ref 135–145)
Total Bilirubin: 0.6 mg/dL (ref 0.3–1.2)
Total Protein: 6.6 g/dL (ref 6.5–8.1)

## 2018-03-25 LAB — CBC
HEMATOCRIT: 43.5 % (ref 39.0–52.0)
Hemoglobin: 14.2 g/dL (ref 13.0–17.0)
MCH: 27.6 pg (ref 26.0–34.0)
MCHC: 32.6 g/dL (ref 30.0–36.0)
MCV: 84.6 fL (ref 78.0–100.0)
Platelets: 151 10*3/uL (ref 150–400)
RBC: 5.14 MIL/uL (ref 4.22–5.81)
RDW: 14.3 % (ref 11.5–15.5)
WBC: 6.4 10*3/uL (ref 4.0–10.5)

## 2018-03-25 LAB — HIV ANTIBODY (ROUTINE TESTING W REFLEX): HIV Screen 4th Generation wRfx: NONREACTIVE

## 2018-03-25 LAB — HEPARIN LEVEL (UNFRACTIONATED): Heparin Unfractionated: 0.45 IU/mL (ref 0.30–0.70)

## 2018-03-25 MED ORDER — SODIUM CHLORIDE 0.9 % IV SOLN
INTRAVENOUS | Status: DC
  Administered 2018-03-25 – 2018-03-27 (×4): via INTRAVENOUS

## 2018-03-25 MED ORDER — ENSURE ENLIVE PO LIQD
237.0000 mL | ORAL | Status: DC
  Administered 2018-03-25 – 2018-03-26 (×2): 237 mL via ORAL

## 2018-03-25 MED ORDER — ALUM & MAG HYDROXIDE-SIMETH 200-200-20 MG/5ML PO SUSP
30.0000 mL | Freq: Four times a day (QID) | ORAL | Status: DC | PRN
  Administered 2018-03-25: 30 mL via ORAL
  Filled 2018-03-25: qty 30

## 2018-03-25 MED ORDER — ACETAMINOPHEN 325 MG PO TABS
650.0000 mg | ORAL_TABLET | Freq: Four times a day (QID) | ORAL | Status: DC | PRN
Start: 2018-03-25 — End: 2018-03-27

## 2018-03-25 NOTE — Progress Notes (Signed)
Initial Nutrition Assessment  DOCUMENTATION CODES:   Obesity unspecified  INTERVENTION:    Ensure Enlive po daily, each supplement provides 350 kcal and 20 grams of protein  NUTRITION DIAGNOSIS:   Increased nutrient needs related to acute illness as evidenced by estimated needs  GOAL:   Patient will meet greater than or equal to 90% of their needs  MONITOR:   PO intake, Supplement acceptance, Labs, Weight trends, Skin, I & O's  REASON FOR ASSESSMENT:   Malnutrition Screening Tool  ASSESSMENT:   57 yo M w/ a hx of obesity and nephrolithiasis who presented with shortness of breath.   RD spoke with pt at bedside. Breakfast meal untouched on tray table. He reports he was having gas. No % meal completion available per flowsheet records.  Reveals his PO intake was poor when he had a kidney stone in 02/2018. He states he was taking bites of food. Also had episodes of "retching". Pt endorses a 10-15 lb weight loss during this time (3 weeks).  Per readings below, pt's weight did drop, however, has come back up. Pt was agreeable to receive Ensure Enlive supplements. Labs and medications reviewed.  NUTRITION - FOCUSED PHYSICAL EXAM:  Completed. No muscle or fat depletions noticed.  Diet Order:   Diet Order           Diet regular Room service appropriate? Yes; Fluid consistency: Thin  Diet effective now         EDUCATION NEEDS:   No education needs have been identified at this time  Skin:  Skin Assessment: Reviewed RN Assessment  Last BM:  N/A  Height:   Ht Readings from Last 1 Encounters:  03/24/18 6' (1.829 m)   Weight:   Wt Readings from Last 1 Encounters:  03/24/18 237 lb (107.5 kg)   Wt Readings from Last 15 Encounters:  03/24/18 237 lb (107.5 kg)  02/20/18 225 lb 2 oz (102.1 kg)  02/19/18 230 lb (104.3 kg)  02/08/18 230 lb (104.3 kg)   BMI:  Body mass index is 32.14 kg/m.  Estimated Nutritional Needs:   Kcal:  2000-2200  Protein:  100-110  gm  Fluid:  2.0-2.2 L  Maureen ChattersKatie Lubertha Leite, RD, LDN Pager #: [phone] After-Hours Pager #: [phone]

## 2018-03-25 NOTE — Progress Notes (Signed)
  Echocardiogram 2D Echocardiogram has been performed.  [place] Arch G Fletcher Ostermiller 03/25/2018, 11:00 AM

## 2018-03-25 NOTE — Care Management (Signed)
CM received for Referral for Medication Check for Several Medications:   Benefits Check Completed: CM will follow to see which medication is utilized. CM will then provide patient with Discount Card. No further needs from CM at this time.    S/W JIM @ [facility] # [phone]  # [phone]   ELIQUIS 10 MG : NONE FORMULARY   1. ELIQUIS 2.5 MG BID  COVER- YES  CO-PAY- ZERO DOLLARS  TIER- NO  PRIOR APPROVAL- NO   2.ELIQUIS 5 MG BID  COVER- YES  CO-PAY- ZERO DOLLARS  TIER- NO  PRIOR APPROVAL- NO    3. XARELTO 15 MG BID  COVER- YES  CO-PAY- ZERO DOLLARS  TIER- NO  PRIOR APPROVAL- NO    4. XARELTO 20 MG DAILY  COVER- YES  CO-PAY- ZERO DOLLARS  TIER- NO  PRIOR APPROVAL- NO    5. PRADAXA 150 MG BID  COVER- NOT COVER AND NONE FORMULARY     PREFERRED: ELIQUIS AND XARELTO  PRIOR APPROVAL- YES - LETTER FOR MED. NECESSITY                   FAX # [phone]

## 2018-03-25 NOTE — Progress Notes (Signed)
ANTICOAGULATION CONSULT NOTE   Pharmacy Consult for heparin Indication: pulmonary embolus  No Known Allergies  Patient Measurements: Height: 6' (182.9 cm) Weight: 237 lb (107.5 kg) IBW/kg (Calculated) : 77.6 Heparin Dosing Weight: 99 kg   Vital Signs: Temp: 97.2 F (36.2 C) (06/10 0332) Temp Source: Oral (06/10 0332) BP: 128/92 (06/10 0332) Pulse Rate: 118 (06/10 0332)  Labs: Recent Labs    03/24/18 1249 03/24/18 2132 03/25/18 0235  HGB 15.8  --  14.2  HCT 47.1  --  43.5  PLT 178  --  151  LABPROT 12.7  --   --   INR 0.96  --   --   HEPARINUNFRC  --  0.53 0.45  CREATININE 1.35*  --  1.25*  TROPONINI 0.04*  --   --     Estimated Creatinine Clearance: 81.6 mL/min (A) (by C-G formula based on SCr of 1.25 mg/dL (H)).   Medical History: Past Medical History:  Diagnosis Date  . History of kidney stones     Medications:  Medications Prior to Admission  Medication Sig Dispense Refill Last Dose  . HYDROcodone-acetaminophen (NORCO/VICODIN) 5-325 MG tablet Take 1 tablet by mouth every 6 (six) hours as needed for severe pain. 12 tablet 0 02/20/2018 at 1000  . HYDROcodone-acetaminophen (NORCO/VICODIN) 5-325 MG tablet Take 1-2 tablets by mouth every 6 (six) hours as needed. 25 tablet 0   . ketorolac (TORADOL) 10 MG tablet Take 1 tablet (10 mg total) by mouth every 6 (six) hours as needed. 20 tablet 0 Past Week at Unknown time  . metoCLOPramide (REGLAN) 10 MG tablet Take 1 tablet (10 mg total) by mouth every 6 (six) hours. 30 tablet 0 02/20/2018 at 0600  . ondansetron (ZOFRAN) 4 MG tablet Take 1 tablet (4 mg total) by mouth daily as needed for nausea or vomiting. 12 tablet 0 unknown  . phenazopyridine (PYRIDIUM) 200 MG tablet Take 1 tablet (200 mg total) by mouth 3 (three) times daily as needed for pain. 10 tablet 0     Assessment: [age] YOM who presents to the ED with shortness of breath and chest discomfort. CT angio shows bilateral PE with saddle embolus and large clot burden  and right heart strain. H/H and Plt wnl. Troponin mildly elevated. Patient is not on any anticoagulation prior to admission.  Heparin level this morning remains therapeutic however with a slight trend down (HL 0.45 << 0.53, goal of 0.3-0.7). CBC wnl. No bleeding noted. Will increase slightly to keep within range.  Goal of Therapy:  Heparin level 0.3-0.7 units/ml Monitor platelets by anticoagulation protocol: Yes   Plan:  - Increase Heparin slightly to 1600 units/hr (16 ml/hr) - Will follow-up on plans to transition to po anticoagulation - Will continue to monitor for any signs/symptoms of bleeding and will follow up with heparin level in the a.m.   Thank you for allowing pharmacy to be a part of this patient's care.  Georgina PillionElizabeth Ulysess Witz, PharmD, BCPS Clinical Pharmacist Pager: [phone] Clinical phone for 03/25/2018 from 7a-3:30p: [phone] If after 3:30p, please call main pharmacy at: [phone] 03/25/2018 8:31 AM

## 2018-03-25 NOTE — Progress Notes (Signed)
[facility] TEAM 1 - Stepdown/ICU TEAM  Sherrine Maplesndrew Empie  ZOX:[id number]:[id number] DOB: 06-Sep-1961 DOA: 03/24/2018 PCP: Richmond CampbellKaplan, Kristen W., PA-C    Brief Narrative:  57 yo M w/ a hx of obesity and nephrolithiasis who presented with shortness of breath.  In May 2019 the patient underwent a workup that found nephrolithiasis that was treated initially with lithotripsy but ultimately led to the need for a ureteral stent.  Since that time he has been slowly returning to his usual state of health but began to experience progressive dyspnea on exertion dating to a week prior to admission.  In the emergency room he was found to be tachycardic to 130.  CT angio chest revealed acute large clot burden B PE with saddle embolus and suspected right heart strain. Also noted was a 9 mm posterior left upper lobe pulmonary nodule.   Significant Events: 6/9 admit w/ PE   Subjective: The patient is resting comfortably in bed.  He denies shortness of breath at rest.  He denies current chest pain nausea vomiting or abdominal pain.  He denies lower extremity edema/swelling/pain.  Assessment & Plan:  Saddle pulmonary embolus TTE pending to evaluate RV - continue heparin drip - begin discussion concerning options for transitioning to oral ( Coumadin versus DOAC) - transitioned to be made once clear no intervention necessary  9 mm left upper lobe pulmonary nodule Will need repeat CT scan chest 3 months to re-evaluate - no prior CT chest exams for comparison   Recent nephrolithiasis requiring ureteral stent Stent has since been removed - no current sx   Acute kidney injury versus CKD Stage 2 Only prior crt was 1.39 during nephrolithiasis tx - keep hydrated and follow trend  Recent Labs  Lab 03/24/18 1249 03/25/18 0235  CREATININE 1.35* 1.25*    Obesity - Body mass index is 32.14 kg/m.   DVT prophylaxis: Heparin drip Code Status: FULL CODE Family Communication: no family present at time of exam  Disposition Plan:  await TTE - if no RV failure transition to oral 6/11   Consultants:  None  Antimicrobials:  None  Objective: Blood pressure (!) 130/98, pulse (!) 105, temperature (!) 97.5 F (36.4 C), temperature source Oral, resp. rate 20, height 6' (1.829 m), weight 107.5 kg (237 lb), SpO2 97 %.  Intake/Output Summary (Last 24 hours) at 03/25/2018 0909 Last data filed at 03/25/2018 0841 Gross per 24 hour  Intake 1252.91 ml  Output 1225 ml  Net 27.91 ml   Filed Weights   03/24/18 1244  Weight: 107.5 kg (237 lb)    Examination: General: No acute respiratory distress Lungs: Clear to auscultation bilaterally without wheezes or crackles Cardiovascular: tachycardic but regular with no murmur gallop or rub Abdomen: Nontender, nondistended, soft, bowel sounds positive, no rebound, no ascites, no appreciable mass Extremities: No significant cyanosis, clubbing, or edema bilateral lower extremities  CBC: Recent Labs  Lab 03/24/18 1249 03/25/18 0235  WBC 6.5 6.4  HGB 15.8 14.2  HCT 47.1 43.5  MCV 84.9 84.6  PLT 178 151   Basic Metabolic Panel: Recent Labs  Lab 03/24/18 1249 03/25/18 0235  NA 143 141  K 4.0 4.5  CL 107 111  CO2 26 22  GLUCOSE 160* 135*  BUN 18 12  CREATININE 1.35* 1.25*  CALCIUM 9.3 8.9  MG 1.9  --    GFR: Estimated Creatinine Clearance: 81.6 mL/min (A) (by C-G formula based on SCr of 1.25 mg/dL (H)).  Liver Function Tests: Recent Labs  Lab 03/24/18 1249  03/25/18 0235  AST 91* 44*  ALT 89* 75*  ALKPHOS 86 70  BILITOT 0.7 0.6  PROT 7.9 6.6  ALBUMIN 4.3 3.5   Recent Labs  Lab 03/24/18 1249  LIPASE 46    Coagulation Profile: Recent Labs  Lab 03/24/18 1249  INR 0.96    Cardiac Enzymes: Recent Labs  Lab 03/24/18 1249  TROPONINI 0.04*    Recent Results (from the past 240 hour(s))  MRSA PCR Screening     Status: None   Collection Time: 03/24/18  6:44 PM  Result Value Ref Range Status   MRSA by PCR NEGATIVE NEGATIVE Final    Comment:          The GeneXpert MRSA Assay (FDA approved for NASAL specimens only), is one component of a comprehensive MRSA colonization surveillance program. It is not intended to diagnose MRSA infection nor to guide or monitor treatment for MRSA infections. Performed at [facility], [address] [place], [place] [address]      Scheduled Meds: . sodium chloride flush  3 mL Intravenous Q12H   Continuous Infusions: . heparin 1,600 Units/hr (03/25/18 6045)     LOS: 1 day   Lonia Blood, MD [facility] Office  [phone] Pager - Text Page per Loretha Stapler as per below:  On-Call/Text Page:      [email or web address]      password [email or web address]  If 7PM-7AM, please contact night-coverage [email or web address] Password [email or web address] 03/25/2018, 9:09 AM

## 2018-03-26 DIAGNOSIS — R0600 Dyspnea, unspecified: Secondary | ICD-10-CM

## 2018-03-26 DIAGNOSIS — I50811 Acute right heart failure: Secondary | ICD-10-CM

## 2018-03-26 LAB — BASIC METABOLIC PANEL
Anion gap: 8 (ref 5–15)
BUN: 13 mg/dL (ref 6–20)
CALCIUM: 8.9 mg/dL (ref 8.9–10.3)
CO2: 26 mmol/L (ref 22–32)
CREATININE: 1.21 mg/dL (ref 0.61–1.24)
Chloride: 109 mmol/L (ref 101–111)
GFR calc Af Amer: >60 mL/min (ref >60)
GLUCOSE: 114 mg/dL — AB (ref 65–99)
Potassium: 4.3 mmol/L (ref 3.5–5.1)
Sodium: 143 mmol/L (ref 135–145)

## 2018-03-26 LAB — CBC
HEMATOCRIT: 42.9 % (ref 39.0–52.0)
Hemoglobin: 13.8 g/dL (ref 13.0–17.0)
MCH: 28 pg (ref 26.0–34.0)
MCHC: 32.2 g/dL (ref 30.0–36.0)
MCV: 87.2 fL (ref 78.0–100.0)
PLATELETS: 147 10*3/uL — AB (ref 150–400)
RBC: 4.92 MIL/uL (ref 4.22–5.81)
RDW: 14.4 % (ref 11.5–15.5)
WBC: 5.7 10*3/uL (ref 4.0–10.5)

## 2018-03-26 LAB — HEPARIN LEVEL (UNFRACTIONATED): Heparin Unfractionated: 0.39 IU/mL (ref 0.30–0.70)

## 2018-03-26 NOTE — Progress Notes (Signed)
[place] TEAM 1 - Stepdown/ICU TEAM  Sherrine Maplesndrew Vanderstelt  OZH:[id number] DOB: Aug 02, 1961 DOA: 03/24/2018 PCP: [place] CampbellKaplan, Kristen W., PA-C    Brief Narrative:  57 yo M w/ a hx of obesity and nephrolithiasis who presented with shortness of breath.  In May 2019 the patient underwent a workup that found nephrolithiasis that was treated initially with lithotripsy but ultimately led to the need for a ureteral stent.  Since that time he has been slowly returning to his usual state of health but began to experience progressive dyspnea on exertion dating to a week prior to admission.  In the emergency room he was found to be tachycardic to 130.  CT angio chest revealed acute large clot burden B PE with saddle embolus and suspected right heart strain. Also noted was a 9 mm posterior left upper lobe pulmonary nodule.   Significant Events: 6/9 admit w/ PE   Subjective: Resting comfortably in bed.  Denies chest pain shortness breath fevers chills nausea or vomiting.  Assessment & Plan:  Saddle pulmonary embolus TTE has suggested significant strain/failure of RV - clinically patient appears remarkably stable - asked pulmonary to evaluate who did not feel that more aggressive intervention was currently indicated - continue IV heparin today - began to ambulate - weaned from O2 - if clinically stable transition to Eliquis 6/12 with plan for discharge home and potential return to work 04/01/18  9 mm left upper lobe pulmonary nodule Will need repeat CT scan chest 3 months to re-evaluate - no prior CT chest exams for comparison  - discussed with patient today  Recent nephrolithiasis requiring ureteral stent Stent has since been removed - no current sx   Acute kidney injury versus CKD Stage 2 Only prior crt was 1.39 during nephrolithiasis tx - keep hydrated and follow trend - renal function stable at this time  Recent Labs  Lab 03/24/18 1249 03/25/18 0235 03/26/18 0343  CREATININE 1.35* 1.25* 1.21     Obesity - Body mass index is 32.14 kg/m.   DVT prophylaxis: Heparin drip Code Status: FULL CODE Family Communication: no family present at time of exam  Disposition Plan: As discussed above potential for discharge home 6/12  Consultants:  None  Antimicrobials:  None  Objective: Blood pressure (!) 134/98, pulse 98, temperature 97.9 F (36.6 C), temperature source Oral, resp. rate (!) 21, height 6' (1.829 m), weight 107.5 kg (237 lb), SpO2 99 %.  Intake/Output Summary (Last 24 hours) at 03/26/2018 1118 Last data filed at 03/26/2018 0900 Gross per 24 hour  Intake 2939.15 ml  Output 1175 ml  Net 1764.15 ml   Filed Weights   03/24/18 1244  Weight: 107.5 kg (237 lb)    Examination: General: No acute resp distress Lungs: Clear to auscultation B - no wheezing  Cardiovascular: tachycardic but regular - no M or rub - no JVD Abdomen: NT/ND, soft, bs+, no mass  Extremities: No signif edema bilateral lower extremities  CBC: Recent Labs  Lab 03/24/18 1249 03/25/18 0235 03/26/18 0343  WBC 6.5 6.4 5.7  HGB 15.8 14.2 13.8  HCT 47.1 43.5 42.9  MCV 84.9 84.6 87.2  PLT 178 151 147*   Basic Metabolic Panel: Recent Labs  Lab 03/24/18 1249 03/25/18 0235 03/26/18 0343  NA 143 141 143  K 4.0 4.5 4.3  CL 107 111 109  CO2 26 22 26   GLUCOSE 160* 135* 114*  BUN 18 12 13   CREATININE 1.35* 1.25* 1.21  CALCIUM 9.3 8.9 8.9  MG 1.9  --   --  GFR: Estimated Creatinine Clearance: 84.3 mL/min (by C-G formula based on SCr of 1.21 mg/dL).  Liver Function Tests: Recent Labs  Lab 03/24/18 1249 03/25/18 0235  AST 91* 44*  ALT 89* 75*  ALKPHOS 86 70  BILITOT 0.7 0.6  PROT 7.9 6.6  ALBUMIN 4.3 3.5   Recent Labs  Lab 03/24/18 1249  LIPASE 46    Coagulation Profile: Recent Labs  Lab 03/24/18 1249  INR 0.96    Cardiac Enzymes: Recent Labs  Lab 03/24/18 1249  TROPONINI 0.04*    Recent Results (from the past 240 hour(s))  MRSA PCR Screening     Status: None    Collection Time: 03/24/18  6:44 PM  Result Value Ref Range Status   MRSA by PCR NEGATIVE NEGATIVE Final    Comment:        The GeneXpert MRSA Assay (FDA approved for NASAL specimens only), is one component of a comprehensive MRSA colonization surveillance program. It is not intended to diagnose MRSA infection nor to guide or monitor treatment for MRSA infections. Performed at [facility], [address] [place], [place] [address]      Scheduled Meds: . feeding supplement (ENSURE ENLIVE)  237 mL Oral Q24H  . sodium chloride flush  3 mL Intravenous Q12H   Continuous Infusions: . sodium chloride 75 mL/hr at 03/26/18 0347  . heparin 1,650 Units/hr (03/26/18 0834)     LOS: 2 days   Lonia Blood, MD [facility] Office  [phone] Pager - Text Page per Loretha Stapler as per below:  On-Call/Text Page:      [email or web address]      password [email or web address]  If 7PM-7AM, please contact night-coverage [email or web address] Password [email or web address] 03/26/2018, 11:18 AM

## 2018-03-26 NOTE — Progress Notes (Signed)
ANTICOAGULATION CONSULT NOTE   Pharmacy Consult for heparin Indication: pulmonary embolus  No Known Allergies  Patient Measurements: Height: 6' (182.9 cm) Weight: 237 lb (107.5 kg) IBW/kg (Calculated) : 77.6 Heparin Dosing Weight: 99 kg   Vital Signs: Temp: 98.2 F (36.8 C) (06/11 0442) Temp Source: Oral (06/11 0442) BP: 136/104 (06/11 0442) Pulse Rate: 96 (06/11 0442)  Labs: Recent Labs    03/24/18 1249 03/24/18 2132 03/25/18 0235 03/26/18 0343  HGB 15.8  --  14.2 13.8  HCT 47.1  --  43.5 42.9  PLT 178  --  151 147*  LABPROT 12.7  --   --   --   INR 0.96  --   --   --   HEPARINUNFRC  --  0.53 0.45 0.39  CREATININE 1.35*  --  1.25* 1.21  TROPONINI 0.04*  --   --   --     Estimated Creatinine Clearance: 84.3 mL/min (by C-G formula based on SCr of 1.21 mg/dL).   Medical History: Past Medical History:  Diagnosis Date  . History of kidney stones     Medications:  Medications Prior to Admission  Medication Sig Dispense Refill Last Dose  . HYDROcodone-acetaminophen (NORCO/VICODIN) 5-325 MG tablet Take 1-2 tablets by mouth every 6 (six) hours as needed. (Patient taking differently: Take 1-2 tablets by mouth every 6 (six) hours as needed for severe pain. ) 25 tablet 0   . ketorolac (TORADOL) 10 MG tablet Take 1 tablet (10 mg total) by mouth every 6 (six) hours as needed. (Patient taking differently: Take 10 mg by mouth every 6 (six) hours as needed for moderate pain. ) 20 tablet 0 Past Week at Unknown time  . metoCLOPramide (REGLAN) 10 MG tablet Take 1 tablet (10 mg total) by mouth every 6 (six) hours. 30 tablet 0 02/20/2018 at 0600  . ondansetron (ZOFRAN) 4 MG tablet Take 1 tablet (4 mg total) by mouth daily as needed for nausea or vomiting. 12 tablet 0 unknown    Assessment: Duane Gregory who presents to the ED with shortness of breath and chest discomfort. CT angio shows bilateral PE with saddle embolus and large clot burden and right heart strain. H/H and Plt wnl. Troponin  mildly elevated. Patient is not on any anticoagulation prior to admission.  Heparin level this morning remains therapeutic however with a slight trend down (HL 0.39 << 0.45, goal of 0.3-0.7). CBC wnl. No bleeding noted. Will increase slightly to keep within range.  Goal of Therapy:  Heparin level 0.3-0.7 units/ml Monitor platelets by anticoagulation protocol: Yes   Plan:  - Increase Heparin slightly to 1650 units/hr (16.5 ml/hr) - Will follow-up on plans to transition to po anticoagulation - Will continue to monitor for any signs/symptoms of bleeding and will follow up with heparin level in the a.m.   Thank you for allowing pharmacy to be a part of this patient's care.  Georgina PillionElizabeth Mattison Stuckey, PharmD, BCPS Clinical Pharmacist Pager: [phone] Clinical phone for 03/26/2018 from 7a-3:30p: [phone] If after 3:30p, please call main pharmacy at: [phone] 03/26/2018 8:17 AM

## 2018-03-26 NOTE — Consult Note (Signed)
PULMONARY / CRITICAL CARE MEDICINE   Name: Modesto Ganoe MRN: [id number] DOB: 11/21/1960    ADMISSION DATE:  03/24/2018 CONSULTATION DATE:  03/26/2018  REFERRING MD:  Sharon Seller  CHIEF COMPLAINT:  Dyspnea  REASON FOR CONSULTATION: management of submassive PE  HISTORY OF PRESENT ILLNESS:   57 year old previously well man. Presented in late April with first episode of nephrolithiasis and was minimally mobile for approximately 5 days around that time. Ureteric stent eventually removed mid May and patient was feeling tired but generally improving until became increasingly SOBOE over past week with sudden worsening on 6/9 with dyspnea at rest.  Admitted to Pulmonary Progressive care at started on IV heparin. Echocardiogram performed for risk stratification shows significant RV dysfunction.   We were asked to provide direction with regards to the role of thrombolytics in this patient.   PAST MEDICAL HISTORY :  He  has a past medical history of History of kidney stones.  PAST SURGICAL HISTORY: He  has a past surgical history that includes Knee surgery; Extracorporeal shock wave lithotripsy (Left, 02/14/2018); Cystoscopy/retrograde/ureteroscopy/stone extraction with basket (Left, 02/20/2018); and Holmium laser application (N/A, 02/20/2018).  No Known Allergies  No current facility-administered medications on file prior to encounter.    Current Outpatient Medications on File Prior to Encounter  Medication Sig  . HYDROcodone-acetaminophen (NORCO/VICODIN) 5-325 MG tablet Take 1-2 tablets by mouth every 6 (six) hours as needed. (Patient taking differently: Take 1-2 tablets by mouth every 6 (six) hours as needed for severe pain. )  . ketorolac (TORADOL) 10 MG tablet Take 1 tablet (10 mg total) by mouth every 6 (six) hours as needed. (Patient taking differently: Take 10 mg by mouth every 6 (six) hours as needed for moderate pain. )  . metoCLOPramide (REGLAN) 10 MG tablet Take 1 tablet (10 mg total) by mouth  every 6 (six) hours.  . ondansetron (ZOFRAN) 4 MG tablet Take 1 tablet (4 mg total) by mouth daily as needed for nausea or vomiting.    FAMILY HISTORY:  His has no family status information on file.    SOCIAL HISTORY: He  reports that he has never smoked. He has never used smokeless tobacco. He reports that he drinks alcohol. He reports that he does not use drugs.  REVIEW OF SYSTEMS:   Review of Systems  Constitutional: Positive for malaise/fatigue.  HENT: Negative.   Eyes: Negative.   Respiratory: Positive for cough (Dry cough) and shortness of breath. Negative for hemoptysis and sputum production.   Cardiovascular: Negative for chest pain and leg swelling.  Gastrointestinal: Negative.   Genitourinary: Negative.   Musculoskeletal: Negative.   Skin: Negative.   Neurological: Negative.   Endo/Heme/Allergies: Negative.   Psychiatric/Behavioral: Negative.    SUBJECTIVE:  Patient subjectively feels better and denies chest pain and feels that his dyspnea is improving.  VITAL SIGNS: BP (!) 134/98 (BP Location: Left Arm)   Pulse 98   Temp 97.9 F (36.6 C) (Oral)   Resp (!) 21   Ht 6' (1.829 m)   Wt 237 lb (107.5 kg)   SpO2 99%   BMI 32.14 kg/m   HEMODYNAMICS:  on no vasoactive infusion.  VENTILATOR SETTINGS:  on 2lpm [place]  INTAKE / OUTPUT: I/O last 3 completed shifts: In: 2827.8 [P.O.:960; I.V.:1867.8] Out: 1900 [Urine:1900]  PHYSICAL EXAMINATION: General:  Mildly overweight in no distress. Neuro:  Intact sensorium with no focal deficits. HEENT:  No scleral pallor.  Cardiovascular:  JVP at  Jaw. Apex normal, R sided summation gallop.  2/6 pulmonic murmur.  Lungs:  Clear Abdomen:  Soft no masses. Musculoskeletal:  No edema. Skin:  No bruising.  LABS:  BMET Recent Labs  Lab 03/24/18 1249 03/25/18 0235 03/26/18 0343  NA 143 141 143  K 4.0 4.5 4.3  CL 107 111 109  CO2 26 22 26   BUN 18 12 13   CREATININE 1.35* 1.25* 1.21  GLUCOSE 160* 135* 114*     Electrolytes Recent Labs  Lab 03/24/18 1249 03/25/18 0235 03/26/18 0343  CALCIUM 9.3 8.9 8.9  MG 1.9  --   --     CBC Recent Labs  Lab 03/24/18 1249 03/25/18 0235 03/26/18 0343  WBC 6.5 6.4 5.7  HGB 15.8 14.2 13.8  HCT 47.1 43.5 42.9  PLT 178 151 147*    Coag's Recent Labs  Lab 03/24/18 1249  INR 0.96    Sepsis Markers No results for input(s): LATICACIDVEN, PROCALCITON, O2SATVEN in the last 168 hours.  ABG No results for input(s): PHART, PCO2ART, PO2ART in the last 168 hours.  Liver Enzymes Recent Labs  Lab 03/24/18 1249 03/25/18 0235  AST 91* 44*  ALT 89* 75*  ALKPHOS 86 70  BILITOT 0.7 0.6  ALBUMIN 4.3 3.5    Cardiac Enzymes Recent Labs  Lab 03/24/18 1249  TROPONINI 0.04*    Glucose No results for input(s): GLUCAP in the last 168 hours.  Imaging  CT on 03/24/18 IMPRESSION: 1. Acute large clot burden bilateral pulmonary embolism with saddle embolus. Positive for acute PE with CT evidence of right heart strain (RV/LV Ratio = 1.3) consistent with at least submassive (intermediate risk) PE. The presence of right heart strain has been associated with an increased risk of morbidity and mortality. Please activate Code PE by paging [phone]. 2. Dilated main pulmonary artery, suggesting pulmonary arterial hypertension. 3. Subsolid 9 mm posterior left upper lobe pulmonary nodule. Follow-up non-contrast CT recommended at 3-6 months to confirm persistence. If unchanged, and solid component remains <6 mm, annual CT is recommended until 5 years of stability has been established. If persistent these nodules should be considered highly suspicious if the solid component of the nodule is 6 mm or greater in size and enlarging. This recommendation follows the consensus statement: Guidelines for Management of Incidental Pulmonary Nodules Detected on CT Images: From the Fleischner Society 2017; Radiology 2017; [phone] Personally reviewed and  concur.  STUDIES:   Echo 03/24/18 Impressions:  - No prior study available for comparison, LVEF normal. RV is   severely dilated with severe systolic dysfunction and   hyperdynamic apex - McConnell sign consistent with acute large   pulmonary embolism.   Bubble study was negative.  DISCUSSION:  Submassive pulmonary embolism with echocardiographic RV strain but no hemodynamic compromise and improving symptomatology. Evidence of RV dysfunction on exam, but no signs of edema.  I conducted a brief literature review on the topic.   The optimal strategy for the management of submassive pulmonary embolism is unknown.  Given that the patient is improving and is now approaching 48h since the embolism, it is reasonable not to proceed with catheter-directed TPA at this time as it has not been shown to universally improve outcome.  Would proceed to EKOS catheter if deteriorates.  Otherwise would recommend transitioning to Eliquis tomorrow and discharging once he is ambulating comfortably.  Would also recommend repeat CT chest in 3 months to assess nodule stability.   Lynnell Catalan, MD [facility] ICU Physician [facility] Critical Care  Pager: [phone] Mobile: [phone] After hours: [phone].  03/26/2018, 10:01 AM

## 2018-03-27 LAB — HEPARIN LEVEL (UNFRACTIONATED): Heparin Unfractionated: 0.35 IU/mL (ref 0.30–0.70)

## 2018-03-27 LAB — CBC
HEMATOCRIT: 41.6 % (ref 39.0–52.0)
Hemoglobin: 13.5 g/dL (ref 13.0–17.0)
MCH: 27.6 pg (ref 26.0–34.0)
MCHC: 32.5 g/dL (ref 30.0–36.0)
MCV: 85.1 fL (ref 78.0–100.0)
Platelets: 147 10*3/uL — ABNORMAL LOW (ref 150–400)
RBC: 4.89 MIL/uL (ref 4.22–5.81)
RDW: 14.1 % (ref 11.5–15.5)
WBC: 4.3 10*3/uL (ref 4.0–10.5)

## 2018-03-27 MED ORDER — APIXABAN 5 MG PO TABS: 5.0000 mg | ORAL_TABLET | Freq: Two times a day (BID) | ORAL | 1 refills | Status: DC

## 2018-03-27 MED ORDER — APIXABAN 5 MG PO TABS: 5.0000 mg | ORAL_TABLET | Freq: Two times a day (BID) | ORAL | Status: DC

## 2018-03-27 MED ORDER — APIXABAN 5 MG PO TABS
10.0000 mg | ORAL_TABLET | Freq: Two times a day (BID) | ORAL | Status: DC
  Administered 2018-03-27: 10 mg via ORAL
  Filled 2018-03-27: qty 2

## 2018-03-27 MED ORDER — APIXABAN 5 MG PO TABS: 10.0000 mg | ORAL_TABLET | Freq: Two times a day (BID) | ORAL | 0 refills | Status: DC

## 2018-03-27 NOTE — Consult Note (Addendum)
PULMONARY / CRITICAL CARE MEDICINE   Name: Duane Gregory MRN: [id number] DOB: 11-25-1960    ADMISSION DATE:  03/24/2018 CONSULTATION DATE:  03/26/2018  REFERRING MD:  Sharon Seller  CHIEF COMPLAINT:  Dyspnea  REASON FOR CONSULTATION: management of submassive PE  HISTORY OF PRESENT ILLNESS:   57 year old previously well man. Presented in late April with first episode of nephrolithiasis and was minimally mobile for approximately 5 days around that time. Ureteric stent eventually removed mid May and patient was feeling tired but generally improving until became increasingly SOBOE over past week with sudden worsening on 6/9 with dyspnea at rest.  Admitted to Pulmonary Progressive care at started on IV heparin. Echocardiogram performed for risk stratification shows significant RV dysfunction.   We were asked to provide direction with regards to the role of thrombolytics in this patient.    PAST MEDICAL HISTORY :  He  has a past medical history of History of kidney stones.  PAST SURGICAL HISTORY: He  has a past surgical history that includes Knee surgery; Extracorporeal shock wave lithotripsy (Left, 02/14/2018); Cystoscopy/retrograde/ureteroscopy/stone extraction with basket (Left, 02/20/2018); and Holmium laser application (N/A, 02/20/2018).  No Known Allergies  No current facility-administered medications on file prior to encounter.    Current Outpatient Medications on File Prior to Encounter  Medication Sig  . Multiple Vitamin (MULTIVITAMIN WITH MINERALS) TABS tablet Take 1 tablet by mouth daily.    FAMILY HISTORY:  He reports that his sister had a PE/DVT during pregnancy and that she might have Factor V Leiden.   SOCIAL HISTORY: He  reports that he has never smoked. He has never used smokeless tobacco. He reports that he drinks alcohol. He reports that he does not use drugs.  REVIEW OF SYSTEMS:   Review of Systems  Constitutional: Positive for malaise/fatigue.  HENT: Negative.   Eyes:  Negative.   Respiratory: Positive for cough (Dry cough) and shortness of breath. Negative for hemoptysis and sputum production.   Cardiovascular: Negative for chest pain and leg swelling.  Gastrointestinal: Negative.   Genitourinary: Negative.   Musculoskeletal: Negative.   Skin: Negative.   Neurological: Negative.   Endo/Heme/Allergies: Negative.   Psychiatric/Behavioral: Negative.    SUBJECTIVE:  Was able to ambulate without dyspnea off oxygen today.  VITAL SIGNS: BP (!) 145/107 (BP Location: Right Arm)   Pulse 93   Temp 97.7 F (36.5 C) (Oral)   Resp 16   Ht 6' (1.829 m)   Wt 237 lb (107.5 kg)   SpO2 97%   BMI 32.14 kg/m   HEMODYNAMICS:  on no vasoactive infusion.  VENTILATOR SETTINGS:  on 2lpm [place]  INTAKE / OUTPUT: I/O last 3 completed shifts: In: 4259.5 [P.O.:960; I.V.:3299.5] Out: 1175 [Urine:1175]  PHYSICAL EXAMINATION: General:  Mildly overweight in no distress. Neuro:  Intact sensorium with no focal deficits. HEENT:  No scleral pallor.  Cardiovascular:  JVP at  Jaw. Apex normal, R sided summation gallop. 2/6 pulmonic murmur.  Lungs:  Clear Abdomen:  Soft no masses. Musculoskeletal:  No edema. Skin:  No bruising.  LABS:  BMET Recent Labs  Lab 03/24/18 1249 03/25/18 0235 03/26/18 0343  NA 143 141 143  K 4.0 4.5 4.3  CL 107 111 109  CO2 26 22 26   BUN 18 12 13   CREATININE 1.35* 1.25* 1.21  GLUCOSE 160* 135* 114*    Electrolytes Recent Labs  Lab 03/24/18 1249 03/25/18 0235 03/26/18 0343  CALCIUM 9.3 8.9 8.9  MG 1.9  --   --  CBC Recent Labs  Lab 03/25/18 0235 03/26/18 0343 03/27/18 0326  WBC 6.4 5.7 4.3  HGB 14.2 13.8 13.5  HCT 43.5 42.9 41.6  PLT 151 147* 147*    Coag's Recent Labs  Lab 03/24/18 1249  INR 0.96    Sepsis Markers No results for input(s): LATICACIDVEN, PROCALCITON, O2SATVEN in the last 168 hours.  ABG No results for input(s): PHART, PCO2ART, PO2ART in the last 168 hours.  Liver Enzymes Recent Labs   Lab 03/24/18 1249 03/25/18 0235  AST 91* 44*  ALT 89* 75*  ALKPHOS 86 70  BILITOT 0.7 0.6  ALBUMIN 4.3 3.5    Cardiac Enzymes Recent Labs  Lab 03/24/18 1249  TROPONINI 0.04*    Glucose No results for input(s): GLUCAP in the last 168 hours.  Imaging  CT on 03/24/18 IMPRESSION: 1. Acute large clot burden bilateral pulmonary embolism with saddle embolus. Positive for acute PE with CT evidence of right heart strain (RV/LV Ratio = 1.3) consistent with at least submassive (intermediate risk) PE. The presence of right heart strain has been associated with an increased risk of morbidity and mortality. Please activate Code PE by paging [phone]. 2. Dilated main pulmonary artery, suggesting pulmonary arterial hypertension. 3. Subsolid 9 mm posterior left upper lobe pulmonary nodule. Follow-up non-contrast CT recommended at 3-6 months to confirm persistence. If unchanged, and solid component remains <6 mm, annual CT is recommended until 5 years of stability has been established. If persistent these nodules should be considered highly suspicious if the solid component of the nodule is 6 mm or greater in size and enlarging. This recommendation follows the consensus statement: Guidelines for Management of Incidental Pulmonary Nodules Detected on CT Images: From the Fleischner Society 2017; Radiology 2017; [id number] Personally reviewed and concur.  STUDIES:   Echo 03/24/18 Impressions:  - No prior study available for comparison, LVEF normal. RV is   severely dilated with severe systolic dysfunction and   hyperdynamic apex - McConnell sign consistent with acute large   pulmonary embolism.   Bubble study was negative.  DISCUSSION:  Submassive pulmonary embolism with echocardiographic RV strain but no hemodynamic compromise and improving symptomatology. Evidence of RV dysfunction on exam, but no signs of edema.  I conducted a brief literature review on the topic.   The  optimal strategy for the management of submassive pulmonary embolism is unknown.  Given that the patient is improving and is now approaching 48h since the embolism, it is reasonable not to proceed with catheter-directed TPA at this time as it has not been shown to universally improve outcome.    He has continued to improve and shows clinical signs of clot recanalization given improved oxygenation.   Have made arrangements for Eliquis teaching with Pharmacy. I recommend a 6 month course of anticoagulation given the possible family thrombophilia. The patient has been advised of this.   Once he has received his first dose he can go home. I have made an appointment to see Dr Delton CoombesByrum as an outpatient on 9/26 at 1330 and repeat CT ahead of that visit.   Total time spent 40 min with >50% in counseling and coordination of care.   Lynnell Catalanavi Jonny Longino, MD [facility] ICU Physician [facility]  Pager: [phone] Mobile: [phone] After hours: [phone].   03/27/2018, 9:46 AM

## 2018-03-27 NOTE — Discharge Summary (Signed)
Physician Discharge Summary  Duane Gregory UJW:[id number] DOB: 03-07-1961 DOA: 03/24/2018  PCP: Richmond Campbell., PA-C  Admit date: 03/24/2018 Discharge date: 03/27/2018  Time spent: *40 minutes  Recommendations for Outpatient Follow-up:  1. Follow up Pulmonology on 07/11/18   Discharge Diagnoses:  Active Problems:   Acute saddle pulmonary embolism with acute cor pulmonale (HCC)   Saddle pulmonary embolus [facility])   Discharge Condition: Stable  Diet recommendation: Regular diet  Filed Weights   03/24/18 1244  Weight: 107.5 kg (237 lb)    History of present illness:  57 yo M w/ a hx of obesity and nephrolithiasis who presented with shortness of breath. In May 2019 the patient underwent a workup that found nephrolithiasis that was treated initially with lithotripsy but ultimately led to the need for a ureteral stent.  Since that time he has been slowly returning to his usual state of health but began to experience progressive dyspnea on exertion dating to a week prior to admission.  In the emergency room he was found to be tachycardic to 130.  CT angio chest revealed acute large clot burden B PE with saddle embolus and suspected right heart strain. Also noted was a 9 mm posterior left upper lobe pulmonary nodule.    Hospital Course:   Saddle pulmonary embolus TTE has suggested significant strain/failure of RV - clinically patient appears remarkably stable - asked pulmonary to evaluate who did not feel that more aggressive intervention was currently indicated - patient was started on  IV heparin today - began to ambulate - weaned from O2 - transitioned to Eliquis. Cleared by pulmonology for discharge. Will discharge on Eliquis.  9 mm left upper lobe pulmonary nodule Will need repeat CT scan chest 3 months to re-evaluate - no prior CT chest exams for comparison  -  Recent nephrolithiasis requiring ureteral stent Stent has since been removed - no current sx   Acute kidney  injury  Only prior crt was 1.39 during nephrolithiasis tx - keep hydrated and follow trend - renal function stable at this time, creatinine is 1.21     Procedures:  None   Consultations:  Pulmonology   Discharge Exam: Vitals:   03/27/18 0002 03/27/18 0803  BP: (!) 147/100 (!) 145/107  Pulse: 91 93  Resp: 16   Temp: 97.7 F (36.5 C) 97.7 F (36.5 C)  SpO2: 96% 97%    General: Appears in no acute distress Cardiovascular: S1S2 RRR Respiratory:  Clear bilaterally  Discharge Instructions   Discharge Instructions    Diet - low sodium heart healthy   Complete by:  As directed    Increase activity slowly   Complete by:  As directed      Allergies as of 03/27/2018   No Known Allergies     Medication List    STOP taking these medications   multivitamin with minerals Tabs tablet     TAKE these medications   apixaban 5 MG Tabs tablet Commonly known as:  ELIQUIS Take 2 tablets (10 mg total) by mouth 2 (two) times daily for 7 days.   apixaban 5 MG Tabs tablet Commonly known as:  ELIQUIS Take 1 tablet (5 mg total) by mouth 2 (two) times daily. Start taking on:  04/03/2018      No Known Allergies Follow-up Information    Leslye Peer, MD. Go on 07/11/2018.   Specialty:  Pulmonary Disease Why:  Appointment at 13:30. Please call to reschedule if necessary. Contact information: [address] [employer]  [place] [address] [phone]            The results of significant diagnostics from this hospitalization (including imaging, microbiology, ancillary and laboratory) are listed below for reference.    Significant Diagnostic Studies: Dg Chest 2 View  Result Date: 03/24/2018 CLINICAL DATA:  Dyspnea EXAM: CHEST - 2 VIEW COMPARISON:  02/19/2018 chest radiograph. FINDINGS: Stable cardiomediastinal silhouette with normal heart size. No pneumothorax. No pleural effusion. Lungs appear clear, with no acute consolidative airspace disease and no pulmonary edema.  IMPRESSION: No active cardiopulmonary disease. Electronically Signed   By: Delbert Phenix M.D.   On: 03/24/2018 13:44   Ct Angio Chest Pe W And/or Wo Contrast  Result Date: 03/24/2018 CLINICAL DATA:  Dyspnea.  Epigastric pain.  Tachycardia. EXAM: CT ANGIOGRAPHY CHEST WITH CONTRAST TECHNIQUE: Multidetector CT imaging of the chest was performed using the standard protocol during bolus administration of intravenous contrast. Multiplanar CT image reconstructions and MIPs were obtained to evaluate the vascular anatomy. CONTRAST:  81mL ISOVUE-370 IOPAMIDOL (ISOVUE-370) INJECTION 76% COMPARISON:  Chest radiograph from earlier today. FINDINGS: Cardiovascular: The study is high quality for the evaluation of pulmonary embolism. Acute saddle pulmonary embolus is present. Acute large burden pulmonary emboli at the bifurcation of the left and right pulmonary arteries and within the lobar, segmental and subsegmental pulmonary arteries of all lung lobes bilaterally. Atherosclerotic nonaneurysmal thoracic aorta. Dilated main pulmonary artery (3.6 cm diameter). Top-normal heart size. Elevated RV/LV ratio 1.3. No significant pericardial fluid/thickening. Mediastinum/Nodes: No discrete thyroid nodules. Unremarkable esophagus. No pathologically enlarged axillary, mediastinal or hilar lymph nodes. Lungs/Pleura: No pneumothorax. No pleural effusion. No acute consolidative airspace disease or lung masses. Sub solid 9 mm posterior left upper lobe nodule (series 5/image 19). No additional significant pulmonary nodules. Upper abdomen: Simple 1.2 cm inferior right liver lobe cyst. Musculoskeletal:  No aggressive appearing focal osseous lesions. Review of the MIP images confirms the above findings. IMPRESSION: 1. Acute large clot burden bilateral pulmonary embolism with saddle embolus. Positive for acute PE with CT evidence of right heart strain (RV/LV Ratio = 1.3) consistent with at least submassive (intermediate risk) PE. The presence of  right heart strain has been associated with an increased risk of morbidity and mortality. Please activate Code PE by paging [phone]. 2. Dilated main pulmonary artery, suggesting pulmonary arterial hypertension. 3. Subsolid 9 mm posterior left upper lobe pulmonary nodule. Follow-up non-contrast CT recommended at 3-6 months to confirm persistence. If unchanged, and solid component remains <6 mm, annual CT is recommended until 5 years of stability has been established. If persistent these nodules should be considered highly suspicious if the solid component of the nodule is 6 mm or greater in size and enlarging. This recommendation follows the consensus statement: Guidelines for Management of Incidental Pulmonary Nodules Detected on CT Images: From the Fleischner Society 2017; Radiology 2017; 284:228-243. Aortic Atherosclerosis (ICD10-I70.0). Critical Value/emergent results were called by telephone at the time of interpretation on 03/24/2018 at 2:41 pm to Dr. Linwood Dibbles , who verbally acknowledged these results. Electronically Signed   By: Delbert Phenix M.D.   On: 03/24/2018 14:49    Microbiology: Recent Results (from the past 240 hour(s))  MRSA PCR Screening     Status: None   Collection Time: 03/24/18  6:44 PM  Result Value Ref Range Status   MRSA by PCR NEGATIVE NEGATIVE Final    Comment:        The GeneXpert MRSA Assay (FDA approved for NASAL specimens only), is one component of a comprehensive  MRSA colonization surveillance program. It is not intended to diagnose MRSA infection nor to guide or monitor treatment for MRSA infections. Performed at [facility], [address] [place], [place] [address]      Labs: Basic Metabolic Panel: Recent Labs  Lab 03/24/18 1249 03/25/18 0235 03/26/18 0343  NA 143 141 143  K 4.0 4.5 4.3  CL 107 111 109  CO2 26 22 26   GLUCOSE 160* 135* 114*  BUN 18 12 13   CREATININE 1.35* 1.25* 1.21  CALCIUM 9.3 8.9 8.9  MG 1.9  --   --    Liver  Function Tests: Recent Labs  Lab 03/24/18 1249 03/25/18 0235  AST 91* 44*  ALT 89* 75*  ALKPHOS 86 70  BILITOT 0.7 0.6  PROT 7.9 6.6  ALBUMIN 4.3 3.5   Recent Labs  Lab 03/24/18 1249  LIPASE 46   No results for input(s): AMMONIA in the last 168 hours. CBC: Recent Labs  Lab 03/24/18 1249 03/25/18 0235 03/26/18 0343 03/27/18 0326  WBC 6.5 6.4 5.7 4.3  HGB 15.8 14.2 13.8 13.5  HCT 47.1 43.5 42.9 41.6  MCV 84.9 84.6 87.2 85.1  PLT 178 151 147* 147*   Cardiac Enzymes: Recent Labs  Lab 03/24/18 1249  TROPONINI 0.04*   BNP: BNP (last 3 results) No results for input(s): BNP in the last 8760 hours.  ProBNP (last 3 results) No results for input(s): PROBNP in the last 8760 hours.  CBG: No results for input(s): GLUCAP in the last 168 hours.     Signed:  Meredeth IdeGagan S Lama MD.  [facility] 03/27/2018, 11:46 AM

## 2018-03-27 NOTE — Progress Notes (Addendum)
ANTICOAGULATION CONSULT NOTE   Pharmacy Consult for heparin Indication: pulmonary embolus  No Known Allergies  Patient Measurements: Height: 6' (182.9 cm) Weight: 237 lb (107.5 kg) IBW/kg (Calculated) : 77.6 Heparin Dosing Weight: 99 kg   Vital Signs: Temp: 97.7 F (36.5 C) (06/12 0803) Temp Source: Oral (06/12 0803) BP: 145/107 (06/12 0803) Pulse Rate: 93 (06/12 0803)  Labs: Recent Labs    03/24/18 1249  03/25/18 0235 03/26/18 0343 03/27/18 0326  HGB 15.8  --  14.2 13.8 13.5  HCT 47.1  --  43.5 42.9 41.6  PLT 178  --  151 147* 147*  LABPROT 12.7  --   --   --   --   INR 0.96  --   --   --   --   HEPARINUNFRC  --    < > 0.45 0.39 0.35  CREATININE 1.35*  --  1.25* 1.21  --   TROPONINI 0.04*  --   --   --   --    < > = values in this interval not displayed.    Estimated Creatinine Clearance: 84.3 mL/min (by C-G formula based on SCr of 1.21 mg/dL).   Medical History: Past Medical History:  Diagnosis Date  . History of kidney stones     Medications:  Medications Prior to Admission  Medication Sig Dispense Refill Last Dose  . Multiple Vitamin (MULTIVITAMIN WITH MINERALS) TABS tablet Take 1 tablet by mouth daily.   Past Week at Unknown time    Assessment: [age] YOM who presents to the ED with shortness of breath and chest discomfort. CT angio shows bilateral PE with saddle embolus and large clot burden and right heart strain. On heparin for saddle PE with right heart strain. Last heparin level was therapeutic at 0.35. CBC wnl  Goal of Therapy:  Heparin level 0.3-0.7 units/ml Monitor platelets by anticoagulation protocol: Yes   Plan:  Stop heparin gtt Start Eliquis 10mg  PO BID x  7 days, then start 5mg  PO BID Monitor CBC, s/s of bleed  Enzo BiNathan Elease Swarm, PharmD, BCPS Clinical Pharmacist Phone number [phone] 03/27/2018 9:25 AM

## 2018-03-27 NOTE — Discharge Instructions (Signed)
Information on my medicine - ELIQUIS (apixaban)  Why was Eliquis prescribed for you? Eliquis was prescribed to treat blood clots that may have been found in the veins of your legs (deep vein thrombosis) or in your lungs (pulmonary embolism) and to reduce the risk of them occurring again.  What do You need to know about Eliquis ? The starting dose is 10 mg (two 5 mg tablets) taken TWICE daily for the FIRST SEVEN (7) DAYS, then on 6/19 the dose is reduced to ONE 5 mg tablet taken TWICE daily.  Eliquis may be taken with or without food.   Try to take the dose about the same time in the morning and in the evening. If you have difficulty swallowing the tablet whole please discuss with your pharmacist how to take the medication safely.  Take Eliquis exactly as prescribed and DO NOT stop taking Eliquis without talking to the doctor who prescribed the medication.  Stopping may increase your risk of developing a new blood clot.  Refill your prescription before you run out.  After discharge, you should have regular check-up appointments with your healthcare provider that is prescribing your Eliquis.    What do you do if you miss a dose? If a dose of ELIQUIS is not taken at the scheduled time, take it as soon as possible on the same day and twice-daily administration should be resumed. The dose should not be doubled to make up for a missed dose.  Important Safety Information A possible side effect of Eliquis is bleeding. You should call your healthcare provider right away if you experience any of the following: ? Bleeding from an injury or your nose that does not stop. ? Unusual colored urine (red or dark brown) or unusual colored stools (red or black). ? Unusual bruising for unknown reasons. ? A serious fall or if you hit your head (even if there is no bleeding).  Some medicines may interact with Eliquis and might increase your risk of bleeding or clotting while on Eliquis. To help avoid  this, consult your healthcare provider or pharmacist prior to using any new prescription or non-prescription medications, including herbals, vitamins, non-steroidal anti-inflammatory drugs (NSAIDs) and supplements.  This website has more information on Eliquis (apixaban): http://www.eliquis.com/eliquis/home

## 2018-03-27 NOTE — Progress Notes (Signed)
Benefit check for Eliquis was completed on 03/21/2018 by Darra LisBrenda B Graves , she also gave him a coupon card at that time; Alexis GoodellB Caspar Favila RN,MHA,BSN [phone]

## 2018-05-30 ENCOUNTER — Telehealth: Payer: Self-pay | Admitting: Emergency Medicine

## 2018-05-30 MED ORDER — APIXABAN 5 MG PO TABS: 5.0000 mg | ORAL_TABLET | Freq: Two times a day (BID) | ORAL | 1 refills | Status: DC

## 2018-05-30 NOTE — Telephone Encounter (Signed)
Based on chart review, he was started on eliquis 03/27/18. He needs to continue, please refill. I can discuss ultimate duration with him when I see him in office.

## 2018-05-30 NOTE — Telephone Encounter (Signed)
Spoke with pt, he states he will run out of Eliquis soon and wanted to know if RB was going to refill it. He has an appt on 9/16 with RB and he has never been a pt here before. He was seen in the ER and referred here for follow up.  RB please advise if we can send him enough until he comes in to see you.

## 2018-05-30 NOTE — Telephone Encounter (Signed)
Called and spoke with patient regarding RB recommendations Placed order for Eliquis 5mg  to pharmacy Pt verbalized understanding Nothing further needed.

## 2018-05-31 ENCOUNTER — Other Ambulatory Visit: Payer: Self-pay | Admitting: Emergency Medicine

## 2018-06-03 ENCOUNTER — Telehealth: Payer: Self-pay | Admitting: Emergency Medicine

## 2018-06-03 MED ORDER — APIXABAN 5 MG PO TABS: 5.0000 mg | ORAL_TABLET | Freq: Two times a day (BID) | ORAL | 1 refills | Status: DC

## 2018-06-03 NOTE — Telephone Encounter (Signed)
Per last phone note, RB has approved eliquis refill until upcoming OV.  Also per last phone note, the rx that was called in for pt was printed and not e-scribed.  rx has been sent to pharmacy with confirmation received.  Pt aware.  Nothing further needed at this time.

## 2018-06-27 ENCOUNTER — Telehealth: Payer: Self-pay | Admitting: *Deleted

## 2018-06-27 DIAGNOSIS — R918 Other nonspecific abnormal finding of lung field: Secondary | ICD-10-CM

## 2018-06-27 NOTE — Telephone Encounter (Signed)
Order has been placed. Nothing further was needed. 

## 2018-06-27 NOTE — Telephone Encounter (Signed)
-----   Message from Leslye Peerobert S Byrum, MD sent at 06/26/2018  5:23 PM EDT ----- Regarding: RE: CT Order He needs a repeat CT without contrast between Sept and December. OK with me to go ahead and order.  RB  ----- Message ----- From: Lorel MonacoLemons, Lindsay C, CMA Sent: 06/26/2018   8:17 AM EDT To: Leslye Peerobert S Byrum, MD Subject: FW: CT Order                                   Dr. Delton CoombesByrum - please advise if you wanted this pt to have a repeat CT. Thanks. ----- Message ----- From: Rayfield CitizenSnead, Stacey J, NT Sent: 06/25/2018   8:31 AM EDT To: Lorel MonacoLindsay C Lemons, CMA Subject: CT Order                                       Sylvie FarrierLindsay   Dylen Embree called to schedule his CT appt today. There is not an order for DR Byrum in the system pt states it was to FU a nodule. He is seeing Dr Delton CoombesByrum 9/26. Will you check to see if Dr Delton CoombesByrum wants to order a CT for the following pt?  MRN [id number] Name Sherrine MaplesHolland, Trae  Thank you   Misty StanleyStacey

## 2018-07-09 ENCOUNTER — Other Ambulatory Visit: Payer: BLUE CROSS/BLUE SHIELD

## 2018-07-10 ENCOUNTER — Ambulatory Visit (INDEPENDENT_AMBULATORY_CARE_PROVIDER_SITE_OTHER)
Admission: RE | Admit: 2018-07-10 | Discharge: 2018-07-10 | Disposition: A | Discharge status: Home / Self Care | Payer: BLUE CROSS/BLUE SHIELD | Source: Ambulatory Visit | Attending: Emergency Medicine | Admitting: Emergency Medicine

## 2018-07-10 DIAGNOSIS — R918 Other nonspecific abnormal finding of lung field: Secondary | ICD-10-CM

## 2018-07-11 ENCOUNTER — Encounter: Payer: Self-pay | Admitting: Emergency Medicine

## 2018-07-11 ENCOUNTER — Ambulatory Visit (INDEPENDENT_AMBULATORY_CARE_PROVIDER_SITE_OTHER): Payer: BLUE CROSS/BLUE SHIELD | Admitting: Emergency Medicine

## 2018-07-11 DIAGNOSIS — I2602 Saddle embolus of pulmonary artery with acute cor pulmonale: Secondary | ICD-10-CM | POA: Diagnosis not present

## 2018-07-11 DIAGNOSIS — R911 Solitary pulmonary nodule: Secondary | ICD-10-CM | POA: Diagnosis not present

## 2018-07-11 NOTE — Assessment & Plan Note (Signed)
He is now completed about 3-1/2 months of Eliquis.  Our goal will be to complete 6 months total.  We will assess echocardiogram for RV function in December.  Likely perform hypercoagulability labs after he is been off Eliquis for a month.  Please continue your Eliquis as you have been taking it.  We will plan to stay on this medication until the beginning of December 2019. You are okay to restart your fluticasone nasal spray (Flonase) We will repeat your echocardiogram in December 2019 to compare with June. After you have been off Eliquis for 1 month we will likely perform some blood work to allow [place] to assess for any risk factors for recurrent blood clots. When you travel consider using compression stockings.  Remember to get up and walk around about every hour. Follow with Dr Delton Coombes in December to review your echocardiogram.

## 2018-07-11 NOTE — Addendum Note (Signed)
Addended by: Jaynee Eagles C on: 07/11/2018 02:04 PM   Modules accepted: Orders

## 2018-07-11 NOTE — Addendum Note (Signed)
Addended by: Jaynee Eagles C on: 07/11/2018 02:07 PM   Modules accepted: Orders

## 2018-07-11 NOTE — Patient Instructions (Signed)
Please continue your Eliquis as you have been taking it.  We will plan to stay on this medication until the beginning of December 2019. You are okay to restart your fluticasone nasal spray (Flonase) We will repeat your echocardiogram in December 2019 to compare with June. After you have been off Eliquis for 1 month we will likely perform some blood work to allow [place] to assess for any risk factors for recurrent blood clots. When you travel consider using compression stockings.  Remember to get up and walk around about every hour. Follow with Dr Delton Coombes in December to review your echocardiogram.

## 2018-07-11 NOTE — Assessment & Plan Note (Signed)
Left upper lobe small opacity is decreasing in size on his most recent CT scan, most consistent with a pulmonary infarct.  He should not need any follow-up CTs.

## 2018-07-11 NOTE — Progress Notes (Signed)
Subjective:    Patient ID: Duane Gregory, male    DOB: 01/07/1961, 57 y.o.   MRN: [id number]  HPI Duane Gregory is a 57 year old never smoker with little past medical history except for renal calculi.  He was admitted in early May 2019 with acute dyspnea and found to have submassive pulmonary embolism by CT scan of the chest.  That CT also showed  a sub-solid left upper lobe opacity suggestive of a possible pulmonary infarct versus nodule.  He was treated with Eliquis.  In retrospect he probably had a period of immobility following ureteral stenting for his kidney stones prior to the acute PE.  He is feeling well, has some occasional indigestion. No dyspnea now - improved. No cough  TTE 03/25/18 >> RV strain and dysfxn.   His sister is on anticoagulation    Review of Systems  Past Medical History:  Diagnosis Date  . History of kidney stones      No family history on file.   Social History   Socioeconomic History  . Marital status: Legally Separated    Spouse name: Not on file  . Number of children: Not on file  . Years of education: Not on file  . Highest education level: Not on file  Occupational History  . Not on file  Social Needs  . Financial resource strain: Not on file  . Food insecurity:    Worry: Not on file    Inability: Not on file  . Transportation needs:    Medical: Not on file    Non-medical: Not on file  Tobacco Use  . Smoking status: Never Smoker  . Smokeless tobacco: Never Used  Substance and Sexual Activity  . Alcohol use: Yes    Comment: occ  . Drug use: Never  . Sexual activity: Not on file  Lifestyle  . Physical activity:    Days per week: Not on file    Minutes per session: Not on file  . Stress: Not on file  Relationships  . Social connections:    Talks on phone: Not on file    Gets together: Not on file    Attends religious service: Not on file    Active member of club or organization: Not on file    Attends meetings of clubs or  organizations: Not on file    Relationship status: Not on file  . Intimate partner violence:    Fear of current or ex partner: Not on file    Emotionally abused: Not on file    Physically abused: Not on file    Forced sexual activity: Not on file  Other Topics Concern  . Not on file  Social History Narrative  . Not on file  he is a Psychologist, occupational, needs to travel for work  No Known Allergies   Outpatient Medications Prior to Visit  Medication Sig Dispense Refill  . apixaban (ELIQUIS) 5 MG TABS tablet Take 1 tablet (5 mg total) by mouth 2 (two) times daily. 60 tablet 1   No facility-administered medications prior to visit.         Objective:   Physical Exam Vitals:   07/11/18 1326  BP: 102/84  Pulse: 82  SpO2: 97%  Weight: 238 lb (108 kg)  Height: 6' (1.829 m)   Gen: Pleasant, well-nourished, in no distress,  normal affect  ENT: No lesions,  mouth clear,  oropharynx clear, no postnasal drip  Neck: No JVD, no stridor  Lungs: No use of accessory  muscles, no wheeze or crackles  Cardiovascular: RRR, heart sounds normal, no murmur or gallops, no cords or peripheral edema  Musculoskeletal: No deformities, no cyanosis or clubbing  Neuro: alert, non focal  Skin: Warm, no lesions or rash     Assessment & Plan:  Pulmonary nodule Left upper lobe small opacity is decreasing in size on his most recent CT scan, most consistent with a pulmonary infarct.  He should not need any follow-up CTs.  Acute saddle pulmonary embolism with acute cor pulmonale (HCC) He is now completed about 3-1/2 months of Eliquis.  Our goal will be to complete 6 months total.  We will assess echocardiogram for RV function in December.  Likely perform hypercoagulability labs after he is been off Eliquis for a month.  Please continue your Eliquis as you have been taking it.  We will plan to stay on this medication until the beginning of December 2019. You are okay to restart your fluticasone nasal spray  (Flonase) We will repeat your echocardiogram in December 2019 to compare with June. After you have been off Eliquis for 1 month we will likely perform some blood work to allow [place] to assess for any risk factors for recurrent blood clots. When you travel consider using compression stockings.  Remember to get up and walk around about every hour. Follow with Dr Delton Coombes in December to review your echocardiogram.  Levy Pupa, MD, PhD 07/11/2018, 2:02 PM [facility] [phone] or if no answer [phone]

## 2018-07-26 ENCOUNTER — Other Ambulatory Visit: Payer: Self-pay | Admitting: Emergency Medicine

## 2018-08-19 ENCOUNTER — Telehealth: Payer: Self-pay | Admitting: Emergency Medicine

## 2018-08-19 NOTE — Telephone Encounter (Signed)
Called and spoke with patient, advised him that we do not schedule them here they are scheduled at the cardiology office. Order has already been placed. Patient stated he would call that office. Nothing further needed.

## 2018-09-16 ENCOUNTER — Other Ambulatory Visit: Payer: Self-pay

## 2018-09-16 ENCOUNTER — Ambulatory Visit (HOSPITAL_COMMUNITY): Payer: BLUE CROSS/BLUE SHIELD | Attending: Cardiovascular Disease

## 2018-09-16 ENCOUNTER — Encounter (INDEPENDENT_AMBULATORY_CARE_PROVIDER_SITE_OTHER): Payer: Self-pay

## 2018-09-16 DIAGNOSIS — I2602 Saddle embolus of pulmonary artery with acute cor pulmonale: Secondary | ICD-10-CM

## 2019-01-20 ENCOUNTER — Other Ambulatory Visit: Payer: Self-pay | Admitting: Emergency Medicine

## 2019-01-21 ENCOUNTER — Telehealth: Payer: Self-pay | Admitting: Emergency Medicine

## 2019-01-21 MED ORDER — APIXABAN 5 MG PO TABS: 5.0000 mg | ORAL_TABLET | Freq: Two times a day (BID) | ORAL | 1 refills | Status: DC

## 2019-01-21 NOTE — Telephone Encounter (Signed)
Call returned to patient. Requesting refill of Eliquis. Confirmed medication and pharmacy. Refill sent in. Voiced understanding. Nothing further is needed at this time.

## 2019-05-26 ENCOUNTER — Other Ambulatory Visit (HOSPITAL_COMMUNITY): Payer: Self-pay | Admitting: Surgery

## 2019-05-26 ENCOUNTER — Other Ambulatory Visit (HOSPITAL_COMMUNITY): Payer: Self-pay | Admitting: Family Medicine

## 2019-05-26 DIAGNOSIS — M7989 Other specified soft tissue disorders: Secondary | ICD-10-CM

## 2019-05-26 DIAGNOSIS — M79605 Pain in left leg: Secondary | ICD-10-CM

## 2019-05-27 ENCOUNTER — Other Ambulatory Visit: Payer: Self-pay

## 2019-05-27 ENCOUNTER — Ambulatory Visit (HOSPITAL_COMMUNITY)
Admission: RE | Admit: 2019-05-27 | Discharge: 2019-05-27 | Disposition: A | Discharge status: Home / Self Care | Payer: BC Managed Care – PPO | Source: Ambulatory Visit | Attending: Family Medicine | Admitting: Family Medicine

## 2019-05-27 DIAGNOSIS — M7989 Other specified soft tissue disorders: Secondary | ICD-10-CM | POA: Insufficient documentation

## 2019-05-27 DIAGNOSIS — M79605 Pain in left leg: Secondary | ICD-10-CM | POA: Diagnosis present

## 2019-05-27 NOTE — Progress Notes (Signed)
Lower extremity venous has been completed.   Preliminary results in CV Proc.   Abram Sander 05/27/2019 8:58 AM

## 2019-07-13 ENCOUNTER — Other Ambulatory Visit: Payer: Self-pay | Admitting: Emergency Medicine

## 2019-08-07 IMAGING — CT CT ABD-PELV W/ CM
2 of 5 series · 16 of 46 positions shown, 18 images · IV contrast (APPLIED)
Comparison: None.

CLINICAL DATA: Abdominal pain with diverticulitis suspected

EXAM:
CT ABDOMEN AND PELVIS WITH CONTRAST
TECHNIQUE: Multidetector CT imaging of the abdomen and pelvis was performed
using the standard protocol following bolus administration of
intravenous contrast.
CONTRAST:  100mL ZRWOTP-G77 IOPAMIDOL (ZRWOTP-G77) INJECTION 61%

[Series 2: axial st · axial · 0.88mm/px · z∈[-321,+169]mm · 13 of 110 slices shown, 15 images]
[im 6/110  soft-tissue]
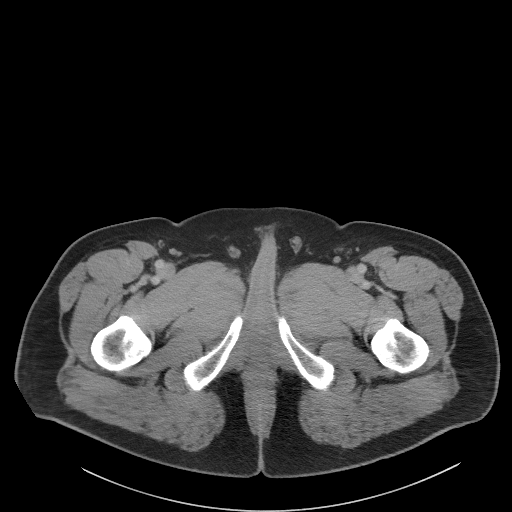
[im 6/110  bone]
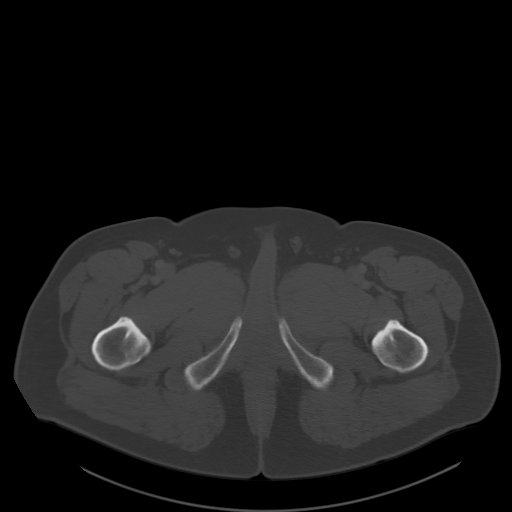
[im 18/110  soft-tissue]
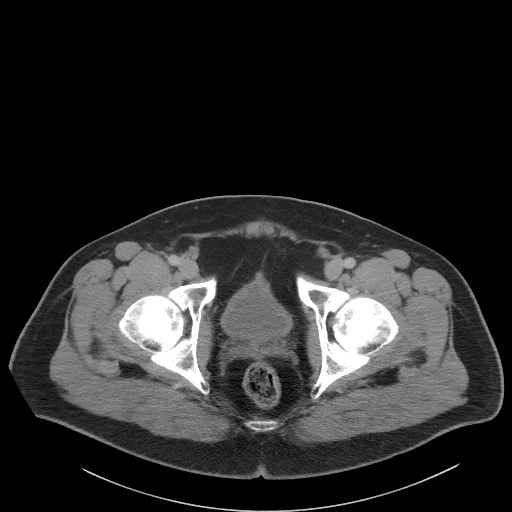
[im 23/110  soft-tissue]
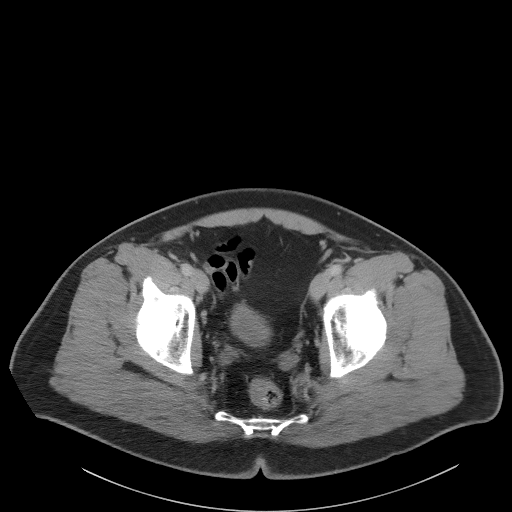
[im 29/110  soft-tissue]
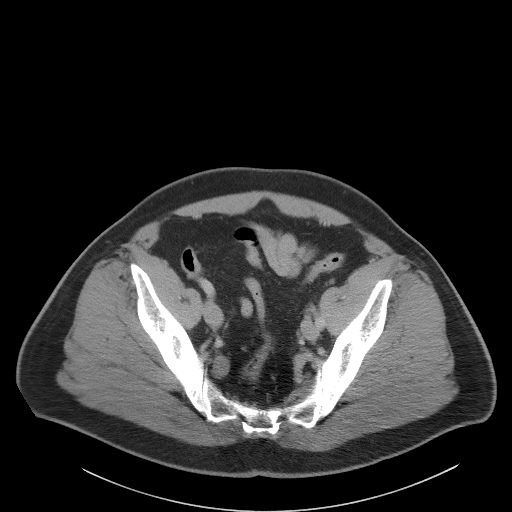
[im 41/110  soft-tissue]
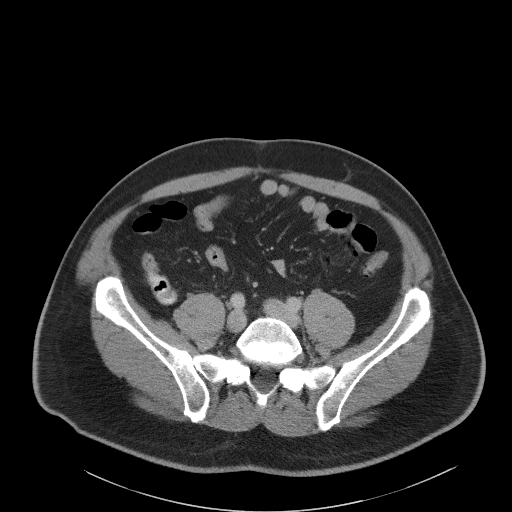
[im 46/110  soft-tissue]
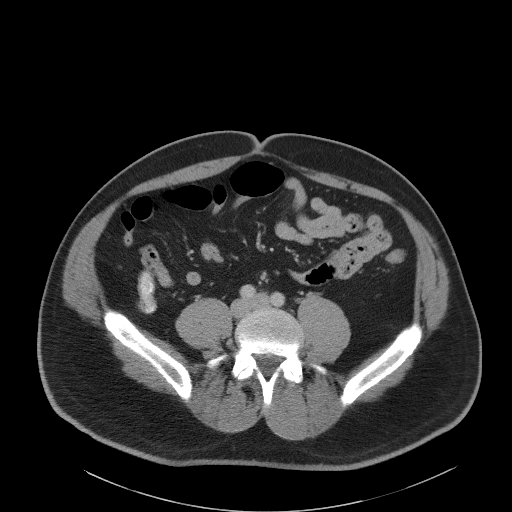
[im 58/110  soft-tissue]
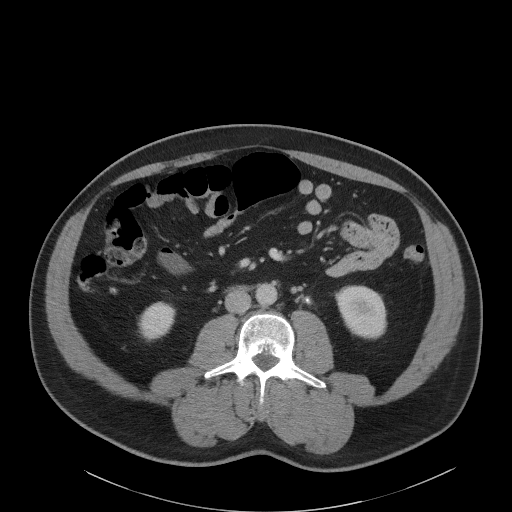
[im 64/110  soft-tissue]
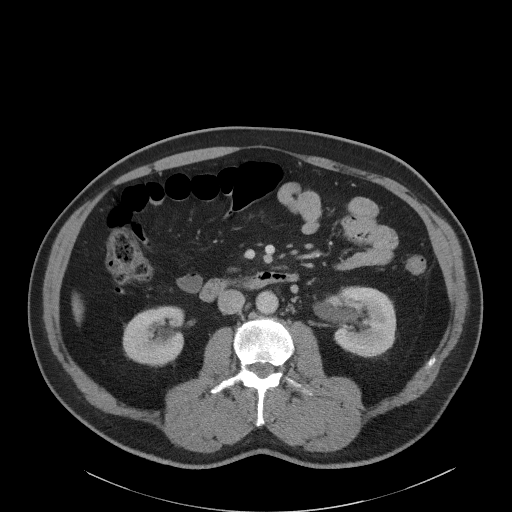
[im 69/110  soft-tissue]
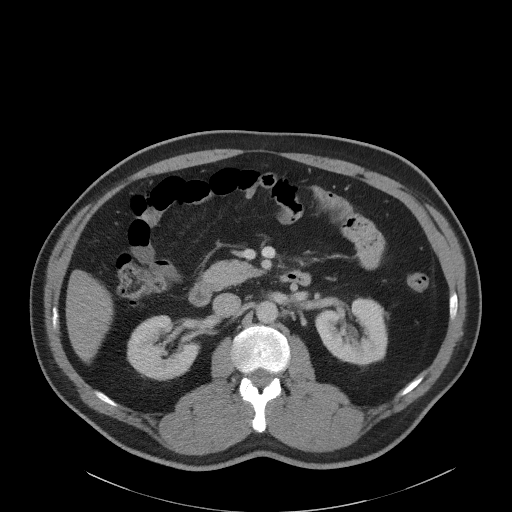
[im 69/110  bone]
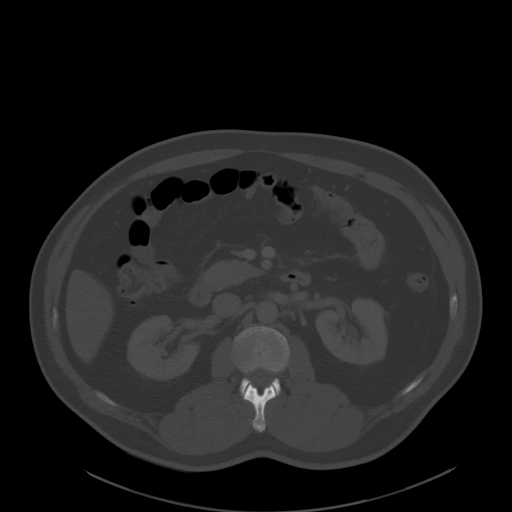
[im 81/110  soft-tissue]
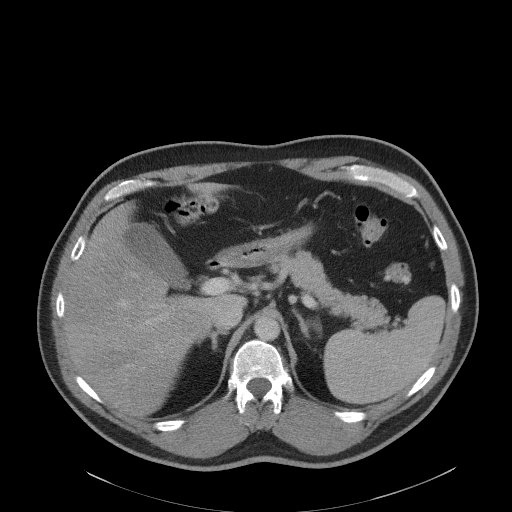
[im 87/110  soft-tissue]
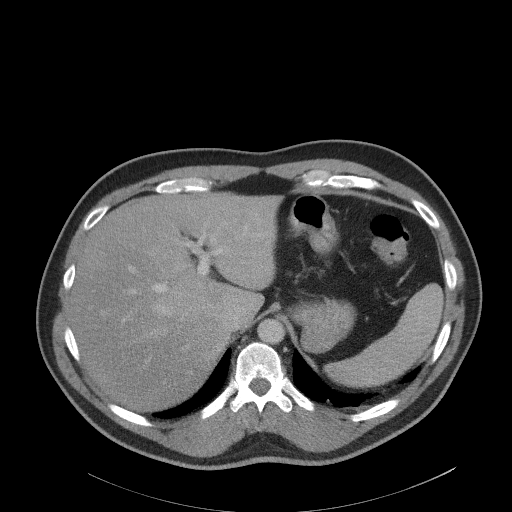
[im 92/110  soft-tissue]
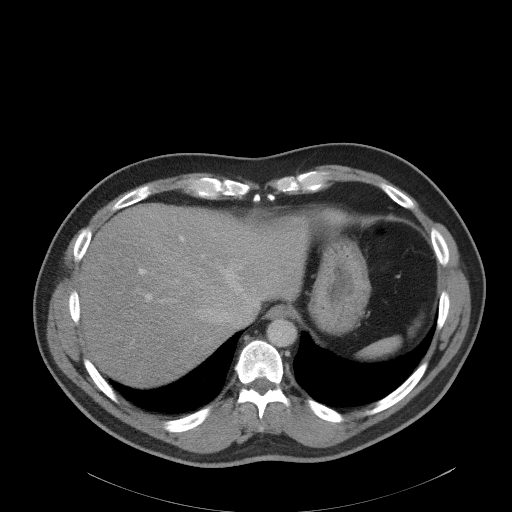
[im 104/110  soft-tissue]
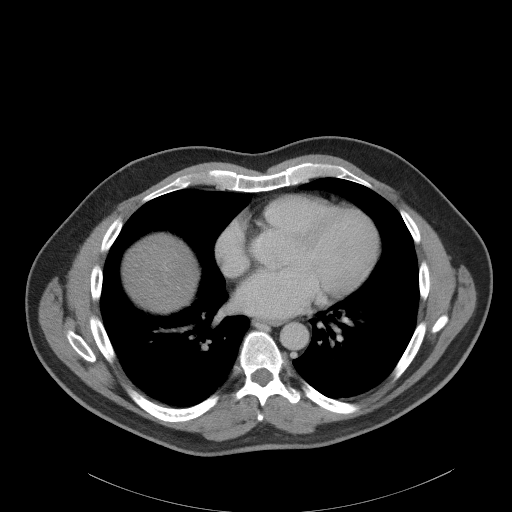

[Series 5: coronal st · coronal · 0.88mm/px · 3 of 103 slices shown]
[im 35/103  soft-tissue]
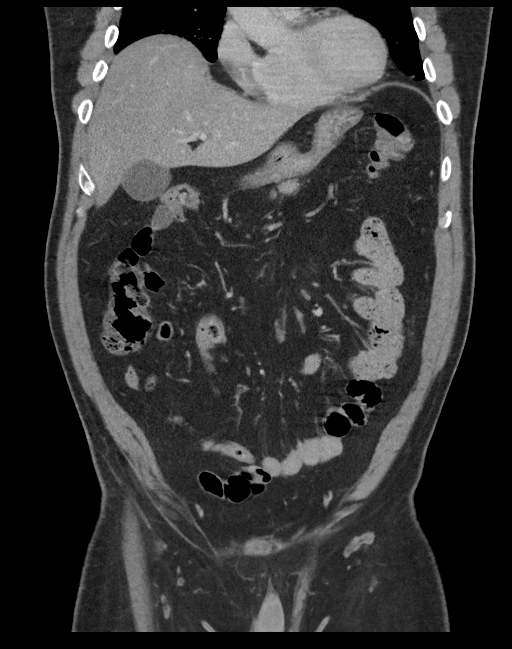
[im 46/103  soft-tissue]
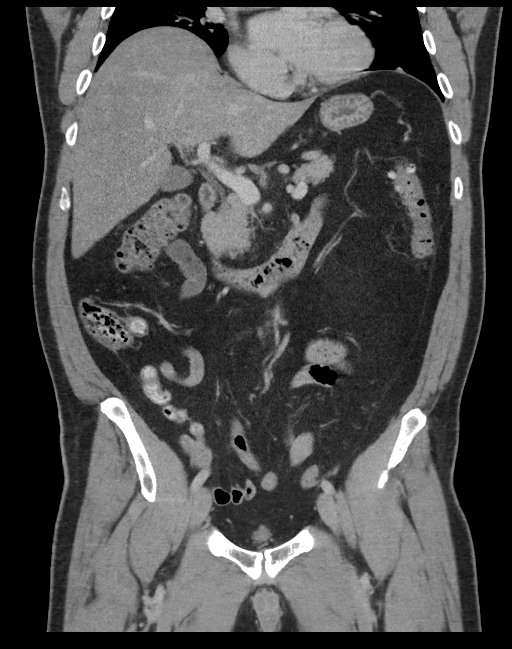
[im 57/103  soft-tissue]
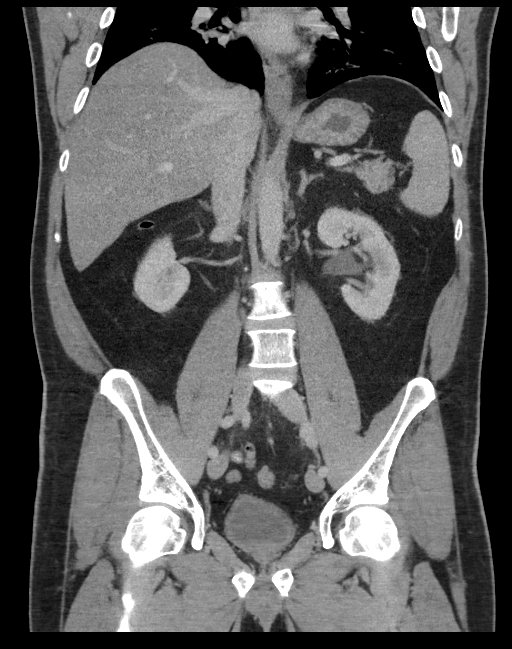

[16 of 46 positions shown; findings below may reference images not displayed]

FINDINGS: Lower chest:  No contributory findings.

Hepatobiliary: Hepatic steatosis.No evidence of biliary obstruction
or stone.

Pancreas: Unremarkable.

Spleen: Unremarkable.

Adrenals/Urinary Tract: Negative adrenals. 1 cm stone at the left
UPJ with minimal hydronephrosis. There are 2 left renal calculi
measuring up to 5 mm. Unremarkable bladder.

Stomach/Bowel: No obstruction. No appendicitis. Mild distal colonic
diverticulosis.

Vascular/Lymphatic: No acute vascular abnormality. No mass or
adenopathy.

Reproductive:No pathologic findings.

Other: No ascites or pneumoperitoneum.

Musculoskeletal: No acute abnormalities.
IMPRESSION: 1. 1 cm left UPJ stone with minimal hydronephrosis.
2. Two left renal calculi measuring up to 5 mm.
3. Hepatic steatosis.
4. Mild distal colonic diverticulosis.

## 2019-10-20 ENCOUNTER — Other Ambulatory Visit: Payer: Self-pay | Admitting: Emergency Medicine

## 2019-11-15 ENCOUNTER — Other Ambulatory Visit: Payer: Self-pay | Admitting: Emergency Medicine

## 2019-12-11 ENCOUNTER — Encounter (HOSPITAL_COMMUNITY): Payer: Self-pay

## 2019-12-11 ENCOUNTER — Emergency Department (HOSPITAL_COMMUNITY): Payer: BC Managed Care – PPO

## 2019-12-11 ENCOUNTER — Emergency Department (HOSPITAL_COMMUNITY)
Admit: 2019-12-11 | Discharge: 2019-12-11 | Disposition: A | Discharge status: Home / Self Care | Payer: BC Managed Care – PPO | Attending: Emergency Medicine | Admitting: Emergency Medicine

## 2019-12-11 ENCOUNTER — Inpatient Hospital Stay (HOSPITAL_COMMUNITY)
Admission: EM | Admit: 2019-12-11 | Discharge: 2019-12-17 | DRG: 175 | Disposition: A | Discharge status: Home / Self Care | Payer: BC Managed Care – PPO | Attending: Internal Medicine | Admitting: Internal Medicine

## 2019-12-11 DIAGNOSIS — U071 COVID-19: Secondary | ICD-10-CM | POA: Diagnosis present

## 2019-12-11 DIAGNOSIS — M7989 Other specified soft tissue disorders: Secondary | ICD-10-CM | POA: Diagnosis not present

## 2019-12-11 DIAGNOSIS — R197 Diarrhea, unspecified: Secondary | ICD-10-CM | POA: Diagnosis present

## 2019-12-11 DIAGNOSIS — I2602 Saddle embolus of pulmonary artery with acute cor pulmonale: Secondary | ICD-10-CM | POA: Diagnosis not present

## 2019-12-11 DIAGNOSIS — Z87442 Personal history of urinary calculi: Secondary | ICD-10-CM | POA: Diagnosis not present

## 2019-12-11 DIAGNOSIS — K760 Fatty (change of) liver, not elsewhere classified: Secondary | ICD-10-CM | POA: Diagnosis present

## 2019-12-11 DIAGNOSIS — E872 Acidosis: Secondary | ICD-10-CM | POA: Diagnosis present

## 2019-12-11 DIAGNOSIS — Z6832 Body mass index (BMI) 32.0-32.9, adult: Secondary | ICD-10-CM | POA: Diagnosis not present

## 2019-12-11 DIAGNOSIS — I2699 Other pulmonary embolism without acute cor pulmonale: Secondary | ICD-10-CM | POA: Diagnosis present

## 2019-12-11 DIAGNOSIS — N179 Acute kidney failure, unspecified: Secondary | ICD-10-CM | POA: Diagnosis present

## 2019-12-11 DIAGNOSIS — I82412 Acute embolism and thrombosis of left femoral vein: Secondary | ICD-10-CM | POA: Diagnosis present

## 2019-12-11 DIAGNOSIS — E876 Hypokalemia: Secondary | ICD-10-CM | POA: Diagnosis present

## 2019-12-11 DIAGNOSIS — Z7901 Long term (current) use of anticoagulants: Secondary | ICD-10-CM

## 2019-12-11 DIAGNOSIS — I2609 Other pulmonary embolism with acute cor pulmonale: Principal | ICD-10-CM | POA: Diagnosis present

## 2019-12-11 DIAGNOSIS — I10 Essential (primary) hypertension: Secondary | ICD-10-CM | POA: Diagnosis present

## 2019-12-11 DIAGNOSIS — B948 Sequelae of other specified infectious and parasitic diseases: Secondary | ICD-10-CM | POA: Diagnosis not present

## 2019-12-11 LAB — BASIC METABOLIC PANEL
Anion gap: 13 (ref 5–15)
BUN: 15 mg/dL (ref 6–20)
CO2: 23 mmol/L (ref 22–32)
Calcium: 9 mg/dL (ref 8.9–10.3)
Chloride: 105 mmol/L (ref 98–111)
Creatinine, Ser: 1.36 mg/dL — ABNORMAL HIGH (ref 0.61–1.24)
GFR calc Af Amer: >60 mL/min (ref >60)
GFR calc non Af Amer: 56 mL/min — ABNORMAL LOW (ref >60)
Glucose, Bld: 118 mg/dL — ABNORMAL HIGH (ref 70–99)
Potassium: 4.3 mmol/L (ref 3.5–5.1)
Sodium: 141 mmol/L (ref 135–145)

## 2019-12-11 LAB — CBC WITH DIFFERENTIAL/PLATELET
Abs Immature Granulocytes: 0.04 10*3/uL (ref 0.00–0.07)
Basophils Absolute: 0 10*3/uL (ref 0.0–0.1)
Basophils Relative: 0 %
Eosinophils Absolute: 0 10*3/uL (ref 0.0–0.5)
Eosinophils Relative: 0 %
HCT: 53.8 % — ABNORMAL HIGH (ref 39.0–52.0)
Hemoglobin: 18 g/dL — ABNORMAL HIGH (ref 13.0–17.0)
Immature Granulocytes: 1 %
Lymphocytes Relative: 8 %
Lymphs Abs: 0.6 10*3/uL — ABNORMAL LOW (ref 0.7–4.0)
MCH: 29 pg (ref 26.0–34.0)
MCHC: 33.5 g/dL (ref 30.0–36.0)
MCV: 86.6 fL (ref 80.0–100.0)
Monocytes Absolute: 0.8 10*3/uL (ref 0.1–1.0)
Monocytes Relative: 10 %
Neutro Abs: 6.6 10*3/uL (ref 1.7–7.7)
Neutrophils Relative %: 81 %
Platelets: 149 10*3/uL — ABNORMAL LOW (ref 150–400)
RBC: 6.21 MIL/uL — ABNORMAL HIGH (ref 4.22–5.81)
RDW: 13.5 % (ref 11.5–15.5)
WBC: 8.1 10*3/uL (ref 4.0–10.5)
nRBC: 0 % (ref 0.0–0.2)

## 2019-12-11 LAB — BRAIN NATRIURETIC PEPTIDE: B Natriuretic Peptide: 112.6 pg/mL — ABNORMAL HIGH (ref 0.0–100.0)

## 2019-12-11 LAB — D-DIMER, QUANTITATIVE: D-Dimer, Quant: >20 ug/mL-FEU — ABNORMAL HIGH (ref 0.00–0.50)

## 2019-12-11 LAB — PROTIME-INR
INR: 1 (ref 0.8–1.2)
Prothrombin Time: 13.1 seconds (ref 11.4–15.2)

## 2019-12-11 LAB — TROPONIN I (HIGH SENSITIVITY)
Troponin I (High Sensitivity): 197 ng/L (ref <18)
Troponin I (High Sensitivity): 302 ng/L (ref <18)

## 2019-12-11 LAB — APTT: aPTT: 29 seconds (ref 24–36)

## 2019-12-11 MED ORDER — IOHEXOL 350 MG/ML SOLN
100.0000 mL | Freq: Once | INTRAVENOUS | Status: AC | PRN
  Administered 2019-12-11: 100 mL via INTRAVENOUS

## 2019-12-11 MED ORDER — HEPARIN BOLUS VIA INFUSION
4000.0000 [IU] | Freq: Once | INTRAVENOUS | Status: AC
  Administered 2019-12-11: 4000 [IU] via INTRAVENOUS
  Filled 2019-12-11: qty 4000

## 2019-12-11 MED ORDER — SODIUM CHLORIDE 0.9 % IV BOLUS
1000.0000 mL | Freq: Once | INTRAVENOUS | Status: AC
  Administered 2019-12-11: 1000 mL via INTRAVENOUS

## 2019-12-11 MED ORDER — ACETAMINOPHEN 325 MG PO TABS
650.0000 mg | ORAL_TABLET | Freq: Once | ORAL | Status: AC
  Administered 2019-12-11: 650 mg via ORAL
  Filled 2019-12-11: qty 2

## 2019-12-11 MED ORDER — IBUPROFEN 800 MG PO TABS
800.0000 mg | ORAL_TABLET | Freq: Once | ORAL | Status: AC
  Administered 2019-12-11: 800 mg via ORAL
  Filled 2019-12-11: qty 1

## 2019-12-11 MED ORDER — HEPARIN (PORCINE) 25000 UT/250ML-% IV SOLN
1800.0000 [IU]/h | INTRAVENOUS | Status: AC
  Administered 2019-12-11 – 2019-12-12 (×4): 1700 [IU]/h via INTRAVENOUS
  Administered 2019-12-13: 1900 [IU]/h via INTRAVENOUS
  Administered 2019-12-14 – 2019-12-16 (×4): 1800 [IU]/h via INTRAVENOUS
  Filled 2019-12-11 (×9): qty 250

## 2019-12-11 NOTE — ED Notes (Signed)
Pt transported to CT ?

## 2019-12-11 NOTE — Progress Notes (Signed)
ANTICOAGULATION CONSULT NOTE - Initial Consult  Pharmacy Consult for heparin Indication: DVT and PE  No Known Allergies  Patient Measurements: Height: 6' (182.9 cm) Weight: 245 lb (111.1 kg) IBW/kg (Calculated) : 77.6 Heparin Dosing Weight: 101.2  Vital Signs: Temp: 98.6 F (37 C) (02/25 1547) BP: 117/91 (02/25 1930) Pulse Rate: 126 (02/25 1930)  Labs: Recent Labs    12/11/19 1656  HGB 18.0*  HCT 53.8*  PLT 149*  APTT 29  LABPROT 13.1  INR 1.0  CREATININE 1.36*  TROPONINIHS 197*    Estimated Creatinine Clearance: 74.3 mL/min (A) (by C-G formula based on SCr of 1.36 mg/dL (H)).   Medical History: Past Medical History:  Diagnosis Date  . History of kidney stones     Assessment: 59 y.o. male with chief complaint shortness of breath and leg swelling. COVID infection diagnosed 2 weeks ago. Hx of saddle PE in 2019, patient not currently on anticoagulation PTA. Venous duplex consistent with LLE DVT and CT showed bilateral acute PE with evidence of right heart strain. Troponin elevated to 197, D-dimer >20,00 mcg/mL, Hgb 18 and plts 149. Pharmacy has been consulted for IV heparin dosing.   Goal of Therapy:  Heparin level 0.3-0.7 units/ml Monitor platelets by anticoagulation protocol: Yes   Plan:  Baseline PT/INR and aPTT Heparin 4000 unit bolus followed by 1700 units/hr infusion  Check heparin level 6 hours after the start of the infusion Monitor daily heparin levels, CBC, s/s of bleed   Derrek Gu 12/11/2019,8:39 PM

## 2019-12-11 NOTE — Progress Notes (Signed)
Bilateral lower extremity venous duplex has been completed. Preliminary results can be found in CV Proc through chart review.  Results were given to Dr. Stevie Kern.  12/11/19 5:52 PM Olen Cordial RVT

## 2019-12-11 NOTE — ED Notes (Signed)
Admitting MD at bedside.

## 2019-12-11 NOTE — ED Notes (Signed)
Dr. Dykstra at bedside.  

## 2019-12-11 NOTE — ED Notes (Signed)
Date and time results received: 12/11/19 924p (use smartphrase ".now" to insert current time)  Test: Trop  Critical Value: 302  Name of Provider Notified: Stevie Kern, MD  Orders Received? Or Actions Taken?: Orders Received - See Orders for details

## 2019-12-11 NOTE — ED Notes (Signed)
Date and time results received: 12/11/19  (use smartphrase ".now" to insert current time)  Test: troponin Critical Value: 197  Name of Provider Notified: Dykstra  Orders Received? Or Actions Taken?: See orders

## 2019-12-11 NOTE — ED Provider Notes (Signed)
[facility] DEPT Provider Note   CSN: [id number] Arrival date & time: 12/11/19  1531     History Chief Complaint  Patient presents with  . Shortness of Breath    Duane Gregory is a 59 y.o. male.  Presents to ER with chief complaint shortness of breath.  Diagnosed with Covid 2 weeks ago.  Initially was relatively asymptomatic, son was positive.  Couple days later he developed cough, cough has been relatively constant over the past couple weeks.  Did not have any shortness of breath initially.  Over the past day has started to have shortness of breath.  Worse with exertion.  Worse with coughing spells.  No chest pain. May have had some left leg swelling, no leg pain.   Hx PE, no longer on anticoagulation.  HPI     Past Medical History:  Diagnosis Date  . History of kidney stones     Patient Active Problem List   Diagnosis Date Noted  . Pulmonary nodule 07/11/2018  . Acute saddle pulmonary embolism with acute cor pulmonale ([place]) 03/24/2018  . Saddle pulmonary embolus ([place]) 03/24/2018    Past Surgical History:  Procedure Laterality Date  . CYSTOSCOPY/RETROGRADE/URETEROSCOPY/STONE EXTRACTION WITH BASKET Left 02/20/2018   Procedure: CYSTOSCOPY/RETROGRADE/URETEROSCOPY/STONE EXTRACTION WITH BASKET LEFT/LASER AND  STENT PLACEMENT;  Surgeon: Raynelle Bring, MD;  Location: [facility];  Service: Urology;  Laterality: Left;  . EXTRACORPOREAL SHOCK WAVE LITHOTRIPSY Left 02/14/2018   Procedure: LEFT EXTRACORPOREAL SHOCK WAVE LITHOTRIPSY (ESWL);  Surgeon: Festus Aloe, MD;  Location: [facility];  Service: Urology;  Laterality: Left;  . HOLMIUM LASER APPLICATION N/A 12/19/91   Procedure: HOLMIUM LASER APPLICATION;  Surgeon: Raynelle Bring, MD;  Location: [facility];  Service: Urology;  Laterality: N/A;  . KNEE SURGERY         History reviewed. No pertinent family history.  Social History   Tobacco Use  . Smoking status: Never Smoker  . Smokeless tobacco: Never  Used  Substance Use Topics  . Alcohol use: Yes    Comment: occ  . Drug use: Never    Home Medications Prior to Admission medications   Medication Sig Start Date End Date Taking? Authorizing Provider  ELIQUIS 5 MG TABS tablet TAKE 1 TABLET BY MOUTH TWICE A DAY Patient not taking: Reported on 12/11/2019 07/14/19   Collene Gobble, MD    Allergies    Patient has no known allergies.  Review of Systems   Review of Systems  Constitutional: Positive for chills, fatigue and fever.  HENT: Negative for ear pain and sore throat.   Eyes: Negative for pain and visual disturbance.  Respiratory: Positive for cough and shortness of breath.   Cardiovascular: Negative for chest pain and palpitations.  Gastrointestinal: Negative for abdominal pain and vomiting.  Genitourinary: Negative for dysuria and hematuria.  Musculoskeletal: Negative for arthralgias and back pain.  Skin: Negative for color change and rash.  Neurological: Negative for seizures and syncope.  All other systems reviewed and are negative.   Physical Exam Updated Vital Signs BP 132/83   Pulse (!) 125   Temp 98.6 F (37 C)   Resp (!) 25   Ht 6' (1.829 m)   Wt 111.1 kg   SpO2 95%   BMI 33.23 kg/m   Physical Exam Vitals and nursing note reviewed.  Constitutional:      Appearance: He is well-developed.  HENT:     Head: Normocephalic and atraumatic.  Eyes:     Conjunctiva/sclera: Conjunctivae normal.  Cardiovascular:  Rate and Rhythm: Normal rate and regular rhythm.     Heart sounds: No murmur.  Pulmonary:     Effort: Pulmonary effort is normal. No respiratory distress.     Breath sounds: Normal breath sounds.     Comments: Mild tachypnea but no respiratory distress Abdominal:     Palpations: Abdomen is soft.     Tenderness: There is no abdominal tenderness.  Musculoskeletal:     Cervical back: Neck supple.     Comments: Mild swelling in left leg, normal distal pulses b/l LE  Skin:    General: Skin is warm  and dry.     Capillary Refill: Capillary refill takes less than 2 seconds.  Neurological:     General: No focal deficit present.     Mental Status: He is alert and oriented to person, place, and time.     ED Results / Procedures / Treatments   Labs (all labs ordered are listed, but only abnormal results are displayed) Labs Reviewed  CBC WITH DIFFERENTIAL/PLATELET - Abnormal; Notable for the following components:      Result Value   RBC 6.21 (*)    Hemoglobin 18.0 (*)    HCT 53.8 (*)    Platelets 149 (*)    Lymphs Abs 0.6 (*)    All other components within normal limits  BASIC METABOLIC PANEL - Abnormal; Notable for the following components:   Glucose, Bld 118 (*)    Creatinine, Ser 1.36 (*)    GFR calc non Af Amer 56 (*)    All other components within normal limits  D-DIMER, QUANTITATIVE (NOT AT [facility]) - Abnormal; Notable for the following components:   D-Dimer, Quant >20.00 (*)    All other components within normal limits  BRAIN NATRIURETIC PEPTIDE - Abnormal; Notable for the following components:   B Natriuretic Peptide 112.6 (*)    All other components within normal limits  TROPONIN I (HIGH SENSITIVITY) - Abnormal; Notable for the following components:   Troponin I (High Sensitivity) 197 (*)    All other components within normal limits  TROPONIN I (HIGH SENSITIVITY) - Abnormal; Notable for the following components:   Troponin I (High Sensitivity) 302 (*)    All other components within normal limits  PROTIME-INR  APTT  HEPARIN LEVEL (UNFRACTIONATED)  CBC    EKG EKG Interpretation  Date/Time:  Thursday December 11 2019 16:13:55 EST Ventricular Rate:  123 PR Interval:    QRS Duration: 89 QT Interval:  319 QTC Calculation: 457 R Axis:   69 Text Interpretation: Sinus tachycardia Borderline T abnormalities, inferior leads Confirmed by Marianna Fussykstra, Javarie Crisp ([id number]) on 12/11/2019 5:45:51 PM   Radiology CT Angio Chest PE W and/or Wo Contrast  Result Date:  12/11/2019 CLINICAL DATA:  Short of breath, chest pain, history of pulmonary embolus EXAM: CT ANGIOGRAPHY CHEST WITH CONTRAST TECHNIQUE: Multidetector CT imaging of the chest was performed using the standard protocol during bolus administration of intravenous contrast. Multiplanar CT image reconstructions and MIPs were obtained to evaluate the vascular anatomy. CONTRAST:  100mL OMNIPAQUE IOHEXOL 350 MG/ML SOLN COMPARISON:  07/10/2018 FINDINGS: Cardiovascular: This is a technically adequate evaluation of the pulmonary vasculature. Bilateral acute pulmonary emboli are seen, most pronounced within the right middle lobe, right lower lobe, and left lower lobe. Right ventricle is dilated consistent with right heart strain, RV/LV ratio measuring 1.9. Heart is not enlarged. No pericardial effusion. Thoracic aorta is normal in caliber. Mediastinum/Nodes: No enlarged mediastinal, hilar, or axillary lymph nodes. Thyroid gland, trachea, and  esophagus demonstrate no significant findings. Lungs/Pleura: Dependent linear consolidation is seen within the left lower lobe, compatible with either hypoventilatory change or less likely developing infarct. No other consolidation, effusion, or pneumothorax. Central airways are patent. The sub solid nodule within the left upper lobe along the major fissure seen previously is no longer identified. No new pulmonary nodules. Upper Abdomen: There is diffuse fatty infiltration of the liver. Otherwise no acute abnormalities. Musculoskeletal: No acute or destructive bony lesions. Reconstructed images demonstrate no additional findings. Review of the MIP images confirms the above findings. IMPRESSION: 1. Bilateral acute pulmonary emboli, most pronounced within the right middle lobe, right lower lobe, and left lower lobe. Positive for acute PE with CTevidence of right heart strain (RV/LV Ratio = 1.9) consistent with at least submassive (intermediate risk) PE. The presence of right heart strain has  been associated with an increased risk of morbidity and mortality. 2. Left lower lobe atelectasis versus developing infarct. 3. Previously identified sub solid nodule within the left upper lobe is no longer identified. 4. Diffuse fatty infiltration of the liver. These results were called by telephone at the time of interpretation on 12/11/2019 at 7:58 pm to provider [facility] , who verbally acknowledged these results. Electronically Signed   By: Sharlet Salina M.D.   On: 12/11/2019 19:58   DG Chest Portable 1 View  Result Date: 12/11/2019 CLINICAL DATA:  Shortness of breath.  COVID diagnosed on 11/28/2019. EXAM: PORTABLE CHEST 1 VIEW COMPARISON:  Chest x-ray dated 03/24/2018 FINDINGS: The heart size and mediastinal contours are within normal limits. Both lungs are clear. The visualized skeletal structures are unremarkable. IMPRESSION: Normal exam. Electronically Signed   By: Francene Boyers M.D.   On: 12/11/2019 17:00   VAS [place] LOWER EXTREMITY VENOUS (DVT) (ONLY [facility] & [facility])  Result Date: 12/11/2019  Lower Venous DVTStudy Indications: Swelling.  Risk Factors: COVID 19 positive. Comparison Study: No prior studies. Performing Technologist: Chanda Busing RVT  Examination Guidelines: A complete evaluation includes B-mode imaging, spectral Doppler, color Doppler, and power Doppler as needed of all accessible portions of each vessel. Bilateral testing is considered an integral part of a complete examination. Limited examinations for reoccurring indications may be performed as noted. The reflux portion of the exam is performed with the patient in reverse Trendelenburg.  +---------+---------------+---------+-----------+----------+--------------+ RIGHT    CompressibilityPhasicitySpontaneityPropertiesThrombus Aging +---------+---------------+---------+-----------+----------+--------------+ CFV      Full           Yes      Yes                                  +---------+---------------+---------+-----------+----------+--------------+ SFJ      Full                                                        +---------+---------------+---------+-----------+----------+--------------+ FV Prox  Full                                                        +---------+---------------+---------+-----------+----------+--------------+ FV Mid   Full                                                        +---------+---------------+---------+-----------+----------+--------------+  FV DistalFull                                                        +---------+---------------+---------+-----------+----------+--------------+ PFV      Full                                                        +---------+---------------+---------+-----------+----------+--------------+ POP      Full           Yes      Yes                                 +---------+---------------+---------+-----------+----------+--------------+ PTV      Full                                                        +---------+---------------+---------+-----------+----------+--------------+ PERO     Full                                                        +---------+---------------+---------+-----------+----------+--------------+   +---------+---------------+---------+-----------+----------+--------------+ LEFT     CompressibilityPhasicitySpontaneityPropertiesThrombus Aging +---------+---------------+---------+-----------+----------+--------------+ CFV      Full           Yes      Yes                                 +---------+---------------+---------+-----------+----------+--------------+ SFJ      Full                                                        +---------+---------------+---------+-----------+----------+--------------+ FV Prox  Full                                                         +---------+---------------+---------+-----------+----------+--------------+ FV Mid   Full                                                        +---------+---------------+---------+-----------+----------+--------------+ FV DistalPartial        Yes      Yes                  Acute          +---------+---------------+---------+-----------+----------+--------------+  PFV      Full                                                        +---------+---------------+---------+-----------+----------+--------------+ POP      Full           Yes      Yes                                 +---------+---------------+---------+-----------+----------+--------------+ PTV      Full                                                        +---------+---------------+---------+-----------+----------+--------------+ PERO     Full                                                        +---------+---------------+---------+-----------+----------+--------------+     Summary: RIGHT: - There is no evidence of deep vein thrombosis in the lower extremity.  - No cystic structure found in the popliteal fossa.  LEFT: - Findings consistent with acute deep vein thrombosis involving the left femoral vein. - No cystic structure found in the popliteal fossa.  *See table(s) above for measurements and observations.    Preliminary     Procedures .Critical Care Performed by: Milagros Loll, MD Authorized by: Milagros Loll, MD   Critical care provider statement:    Critical care time (minutes):  55   Critical care was necessary to treat or prevent imminent or life-threatening deterioration of the following conditions:  Respiratory failure   Critical care was time spent personally by me on the following activities:  Discussions with consultants, evaluation of patient's response to treatment, examination of patient, ordering and performing treatments and interventions, ordering and review of laboratory  studies, ordering and review of radiographic studies, pulse oximetry, re-evaluation of patient's condition, obtaining history from patient or surrogate and review of old charts   (including critical care time)  Medications Ordered in ED Medications  heparin bolus via infusion 4,000 Units (4,000 Units Intravenous Bolus from Bag 12/11/19 1853)    Followed by  heparin ADULT infusion 100 units/mL (25000 units/259mL sodium chloride 0.45%) (1,700 Units/hr Intravenous New Bag/Given 12/11/19 1856)  sodium chloride 0.9 % bolus 1,000 mL (0 mLs Intravenous Stopped 12/11/19 1930)  acetaminophen (TYLENOL) tablet 650 mg (650 mg Oral Given 12/11/19 1653)  iohexol (OMNIPAQUE) 350 MG/ML injection 100 mL (100 mLs Intravenous Contrast Given 12/11/19 1948)  acetaminophen (TYLENOL) tablet 650 mg (650 mg Oral Given 12/11/19 2158)  ibuprofen (ADVIL) tablet 800 mg (800 mg Oral Given 12/11/19 2239)    ED Course  I have reviewed the triage vital signs and the nursing notes.  Pertinent labs & imaging results that were available during my care of the patient were reviewed by me and considered in my medical decision making (see chart for details).  Clinical Course as of Dec 10 2328  Thu  Dec 11, 2019  1835 Rechecked, updated on results, will start heparin, admit after CT chest   [RD]  1900 Likely from PE  Troponin I (High Sensitivity)(!!): 197 [RD]  1954 Reviewed with radiology, will review with pulm, admit hosp   [RD]  2053 Discussed with Ogan - recommends hosp admit, echo   [RD]    Clinical Course User Index [RD] Milagros Loll, MD   MDM Rules/Calculators/A&P                       59 year old male with shortness of breath in setting of recent COVID-19.  Past medical history PE.  DVT study positive for femoral neck DVT.  CTA scan positive PE.  Start heparin, admit hospitalist.  Pulm recommending echo but no other intervention at this point. Hemodynamically, pt tachycardic but normal BP, currently stable on  RA.  Final Clinical Impression(s) / ED Diagnoses Final diagnoses:  Other acute pulmonary embolism without acute cor pulmonale (HCC)  Acute deep vein thrombosis (DVT) of femoral vein of left lower extremity [facility])    Rx / DC Orders ED Discharge Orders    None       Milagros Loll, MD 12/11/19 2332

## 2019-12-11 NOTE — H&P (Signed)
History and Physical  Duane Gregory UMP:[id number] DOB: 12-14-1960 DOA: 12/11/2019  Referring physician: ER provider PCP: Aletha Halim., PA-C  Outpatient Specialists:    Patient coming from: Home  Chief Complaint: Shortness of breath  HPI: Patient is a 59 year old Caucasian male with past medical history significant for nephrolithiasis, pulmonary embolism that was diagnosed about 2 years ago and the patient was on anticoagulation for 2 years.  Patient completed 2-years of anticoagulation in December 2020.  2 weeks ago, patient was diagnosed with COVID-19 infection that was managed supportively at home.  Over the course of last 2 weeks, patient has developed progressive shortness of breath leading to current presentation to the hospital.  CT of the chest done on presentation to the hospital revealed submassive bilateral pulmonary emboli with right heart strain.  Doppler vascular ultrasound of the lower extremity revealed acute deep vein thrombosis involving the left femoral vein.  No headache, no neck pain, no fever or chills, no GI symptoms and no urinary symptoms.  Mild worsening of the renal function is also noted.  Patient be admitted for further assessment and management.  Due to the right heart strain, patient discussed case with the pulmonary/ICU team and anticoagulation with heparin was advised.  ED Course: On presentation to the hospital, vital signs revealed temperature of 98.6, blood pressure 111/86, heart rate of 130 bpm, respiratory rate of 27 and O2 sat of 92 to 98%.  Work-up done by the ER provider is as documented above.  Patient is currently on anticoagulation.  ER provider has discussed case with the pulmonary and ICU team and stepdown admission was advised.  Pertinent labs: As documented above.  EKG: Independently reviewed.   Imaging: independently reviewed.   Review of Systems:  Negative for fever, visual changes, sore throat, rash, new muscle aches, chest pain, dysuria,  bleeding, n/v/abdominal pain.  Past Medical History:  Diagnosis Date  . History of kidney stones     Past Surgical History:  Procedure Laterality Date  . CYSTOSCOPY/RETROGRADE/URETEROSCOPY/STONE EXTRACTION WITH BASKET Left 02/20/2018   Procedure: CYSTOSCOPY/RETROGRADE/URETEROSCOPY/STONE EXTRACTION WITH BASKET LEFT/LASER AND  STENT PLACEMENT;  Surgeon: Raynelle Bring, MD;  Location: [facility];  Service: Urology;  Laterality: Left;  . EXTRACORPOREAL SHOCK WAVE LITHOTRIPSY Left 02/14/2018   Procedure: LEFT EXTRACORPOREAL SHOCK WAVE LITHOTRIPSY (ESWL);  Surgeon: Festus Aloe, MD;  Location: [facility];  Service: Urology;  Laterality: Left;  . HOLMIUM LASER APPLICATION N/A 4/0/0867   Procedure: HOLMIUM LASER APPLICATION;  Surgeon: Raynelle Bring, MD;  Location: [facility];  Service: Urology;  Laterality: N/A;  . KNEE SURGERY       reports that he has never smoked. He has never used smokeless tobacco. He reports current alcohol use. He reports that he does not use drugs.  No Known Allergies  History reviewed. No pertinent family history.   Prior to Admission medications   Medication Sig Start Date End Date Taking? Authorizing Provider  ELIQUIS 5 MG TABS tablet TAKE 1 TABLET BY MOUTH TWICE A DAY Patient not taking: Reported on 12/11/2019 07/14/19   Collene Gobble, MD    Physical Exam: Vitals:   12/11/19 2200 12/11/19 2230 12/11/19 2300 12/11/19 2320  BP: 139/89 127/90 132/83   Pulse: (!) 130 (!) 127 (!) 124 (!) 125  Resp: (!) 35 (!) 22 (!) 27 (!) 25  Temp:      SpO2: 95% 94% 94% 95%  Weight:      Height:        Constitutional:  . Appears calm  and comfortable at rest, but becomes out of breath while speaking. Eyes:  . No pallor. No jaundice.  ENMT:  . external ears, nose appear normal Neck:  . Neck is supple. No JVD Respiratory:  . CTA bilaterally, no w/r/r.  . Respiratory effort normal. No retractions or accessory muscle use Cardiovascular:  . S1S2, tachycardia . No LE  extremity edema   Abdomen:  . Abdomen is soft and non tender. Organs are difficult to assess. Neurologic:  . Awake and alert. . Moves all limbs.  Wt Readings from Last 3 Encounters:  12/11/19 111.1 kg  07/11/18 108 kg  03/24/18 107.5 kg    I have personally reviewed following labs and imaging studies  Labs on Admission:  CBC: Recent Labs  Lab 12/11/19 1656  WBC 8.1  NEUTROABS 6.6  HGB 18.0*  HCT 53.8*  MCV 86.6  PLT 149*   Basic Metabolic Panel: Recent Labs  Lab 12/11/19 1656  NA 141  K 4.3  CL 105  CO2 23  GLUCOSE 118*  BUN 15  CREATININE 1.36*  CALCIUM 9.0   Liver Function Tests: No results for input(s): AST, ALT, ALKPHOS, BILITOT, PROT, ALBUMIN in the last 168 hours. No results for input(s): LIPASE, AMYLASE in the last 168 hours. No results for input(s): AMMONIA in the last 168 hours. Coagulation Profile: Recent Labs  Lab 12/11/19 1656  INR 1.0   Cardiac Enzymes: No results for input(s): CKTOTAL, CKMB, CKMBINDEX, TROPONINI in the last 168 hours. BNP (last 3 results) No results for input(s): PROBNP in the last 8760 hours. HbA1C: No results for input(s): HGBA1C in the last 72 hours. CBG: No results for input(s): GLUCAP in the last 168 hours. Lipid Profile: No results for input(s): CHOL, HDL, LDLCALC, TRIG, CHOLHDL, LDLDIRECT in the last 72 hours. Thyroid Function Tests: No results for input(s): TSH, T4TOTAL, FREET4, T3FREE, THYROIDAB in the last 72 hours. Anemia Panel: No results for input(s): VITAMINB12, FOLATE, FERRITIN, TIBC, IRON, RETICCTPCT in the last 72 hours. Urine analysis:    Component Value Date/Time   COLORURINE YELLOW 02/19/2018 1941   APPEARANCEUR CLEAR 02/19/2018 1941   LABSPEC 1.025 02/19/2018 1941   PHURINE 5.5 02/19/2018 1941   GLUCOSEU NEGATIVE 02/19/2018 1941   HGBUR MODERATE (A) 02/19/2018 1941   BILIRUBINUR SMALL (A) 02/19/2018 1941   KETONESUR >80 (A) 02/19/2018 1941   PROTEINUR NEGATIVE 02/19/2018 1941   NITRITE  NEGATIVE 02/19/2018 1941   LEUKOCYTESUR NEGATIVE 02/19/2018 1941   Sepsis Labs: @LABRCNTIP (procalcitonin:4,lacticidven:4) )No results found for this or any previous visit (from the past 240 hour(s)).    Radiological Exams on Admission: CT Angio Chest PE W and/or Wo Contrast  Result Date: 12/11/2019 CLINICAL DATA:  Short of breath, chest pain, history of pulmonary embolus EXAM: CT ANGIOGRAPHY CHEST WITH CONTRAST TECHNIQUE: Multidetector CT imaging of the chest was performed using the standard protocol during bolus administration of intravenous contrast. Multiplanar CT image reconstructions and MIPs were obtained to evaluate the vascular anatomy. CONTRAST:  12/13/2019 OMNIPAQUE IOHEXOL 350 MG/ML SOLN COMPARISON:  07/10/2018 FINDINGS: Cardiovascular: This is a technically adequate evaluation of the pulmonary vasculature. Bilateral acute pulmonary emboli are seen, most pronounced within the right middle lobe, right lower lobe, and left lower lobe. Right ventricle is dilated consistent with right heart strain, RV/LV ratio measuring 1.9. Heart is not enlarged. No pericardial effusion. Thoracic aorta is normal in caliber. Mediastinum/Nodes: No enlarged mediastinal, hilar, or axillary lymph nodes. Thyroid gland, trachea, and esophagus demonstrate no significant findings. Lungs/Pleura: Dependent linear consolidation is  seen within the left lower lobe, compatible with either hypoventilatory change or less likely developing infarct. No other consolidation, effusion, or pneumothorax. Central airways are patent. The sub solid nodule within the left upper lobe along the major fissure seen previously is no longer identified. No new pulmonary nodules. Upper Abdomen: There is diffuse fatty infiltration of the liver. Otherwise no acute abnormalities. Musculoskeletal: No acute or destructive bony lesions. Reconstructed images demonstrate no additional findings. Review of the MIP images confirms the above findings. IMPRESSION: 1.  Bilateral acute pulmonary emboli, most pronounced within the right middle lobe, right lower lobe, and left lower lobe. Positive for acute PE with CTevidence of right heart strain (RV/LV Ratio = 1.9) consistent with at least submassive (intermediate risk) PE. The presence of right heart strain has been associated with an increased risk of morbidity and mortality. 2. Left lower lobe atelectasis versus developing infarct. 3. Previously identified sub solid nodule within the left upper lobe is no longer identified. 4. Diffuse fatty infiltration of the liver. These results were called by telephone at the time of interpretation on 12/11/2019 at 7:58 pm to provider [facility] , who verbally acknowledged these results. Electronically Signed   By: Sharlet Salina M.D.   On: 12/11/2019 19:58   DG Chest Portable 1 View  Result Date: 12/11/2019 CLINICAL DATA:  Shortness of breath.  COVID diagnosed on 11/28/2019. EXAM: PORTABLE CHEST 1 VIEW COMPARISON:  Chest x-ray dated 03/24/2018 FINDINGS: The heart size and mediastinal contours are within normal limits. Both lungs are clear. The visualized skeletal structures are unremarkable. IMPRESSION: Normal exam. Electronically Signed   By: Francene Boyers M.D.   On: 12/11/2019 17:00   VAS [place] LOWER EXTREMITY VENOUS (DVT) (ONLY [facility] & [facility])  Result Date: 12/11/2019  Lower Venous DVTStudy Indications: Swelling.  Risk Factors: COVID 19 positive. Comparison Study: No prior studies. Performing Technologist: Chanda Busing RVT  Examination Guidelines: A complete evaluation includes B-mode imaging, spectral Doppler, color Doppler, and power Doppler as needed of all accessible portions of each vessel. Bilateral testing is considered an integral part of a complete examination. Limited examinations for reoccurring indications may be performed as noted. The reflux portion of the exam is performed with the patient in reverse Trendelenburg.   +---------+---------------+---------+-----------+----------+--------------+ RIGHT    CompressibilityPhasicitySpontaneityPropertiesThrombus Aging +---------+---------------+---------+-----------+----------+--------------+ CFV      Full           Yes      Yes                                 +---------+---------------+---------+-----------+----------+--------------+ SFJ      Full                                                        +---------+---------------+---------+-----------+----------+--------------+ FV Prox  Full                                                        +---------+---------------+---------+-----------+----------+--------------+ FV Mid   Full                                                        +---------+---------------+---------+-----------+----------+--------------+  FV DistalFull                                                        +---------+---------------+---------+-----------+----------+--------------+ PFV      Full                                                        +---------+---------------+---------+-----------+----------+--------------+ POP      Full           Yes      Yes                                 +---------+---------------+---------+-----------+----------+--------------+ PTV      Full                                                        +---------+---------------+---------+-----------+----------+--------------+ PERO     Full                                                        +---------+---------------+---------+-----------+----------+--------------+   +---------+---------------+---------+-----------+----------+--------------+ LEFT     CompressibilityPhasicitySpontaneityPropertiesThrombus Aging +---------+---------------+---------+-----------+----------+--------------+ CFV      Full           Yes      Yes                                  +---------+---------------+---------+-----------+----------+--------------+ SFJ      Full                                                        +---------+---------------+---------+-----------+----------+--------------+ FV Prox  Full                                                        +---------+---------------+---------+-----------+----------+--------------+ FV Mid   Full                                                        +---------+---------------+---------+-----------+----------+--------------+ FV DistalPartial        Yes      Yes                  Acute          +---------+---------------+---------+-----------+----------+--------------+  PFV      Full                                                        +---------+---------------+---------+-----------+----------+--------------+ POP      Full           Yes      Yes                                 +---------+---------------+---------+-----------+----------+--------------+ PTV      Full                                                        +---------+---------------+---------+-----------+----------+--------------+ PERO     Full                                                        +---------+---------------+---------+-----------+----------+--------------+     Summary: RIGHT: - There is no evidence of deep vein thrombosis in the lower extremity.  - No cystic structure found in the popliteal fossa.  LEFT: - Findings consistent with acute deep vein thrombosis involving the left femoral vein. - No cystic structure found in the popliteal fossa.  *See table(s) above for measurements and observations.    Preliminary     EKG: Independently reviewed.   Active Problems:   Acute pulmonary embolism (HCC)   Assessment/Plan Bilateral submassive pulmonary emboli/recurrent pulmonary emboli/left lower extremity DVT: -Admit patient to stepdown unit -Continue anticoagulation -Echocardiogram -ER provider  has discussed case with the ICU team -Further management will depend on results of the echocardiogram (i.e catheter directed thrombolysis if indicated). -Further management will depend on hospital course. -Likely, patient will need lifetime anticoagulation.   COVID-19 infection: Patient was diagnosed with COVID-19 infection about 2 weeks ago. Patient was managed supportively. COVID-19 infection may have contributed to the current massive thrombosis. Continue to manage expectantly.  Mild acute kidney injury: -Monitor closely. -Cautious hydration as patient is at risk for worsening renal function secondary to contrast dye exposure -Monitor renal function and electrolytes closely.  DVT prophylaxis: Heparin drip Code Status: Full code Family Communication:  Disposition Plan: Home eventually.  Guarded prognosis. Consults called: ER provider has discussed case with pulmonology/ICU team Admission status: Inpatient  Time spent: 65 minutes  Berton Mount, MD  [facility] Pager #: [phone] 7PM-7AM contact night coverage as above  12/11/2019, 11:37 PM

## 2019-12-11 NOTE — ED Triage Notes (Signed)
Pt BIBA from home. Pt c/o SHOB, but thinks he doubled up on hydrocodone-homatropine, and had 60ml within 4 hours. Pt was dx with COVID 2/12, after being tested on 2/10, as his son was positive.  Pt c/o dry mouth. Denies chest pain. Pt states that since the ride over, he is feeling better and less anxious. Per EMS, his respiratory rate has decreased as well.   HR 128 RR 32 , decreased to  24

## 2019-12-11 NOTE — ED Notes (Signed)
Pt provided with water and applesauce.

## 2019-12-12 ENCOUNTER — Inpatient Hospital Stay (HOSPITAL_COMMUNITY): Payer: BC Managed Care – PPO

## 2019-12-12 DIAGNOSIS — I2609 Other pulmonary embolism with acute cor pulmonale: Principal | ICD-10-CM

## 2019-12-12 DIAGNOSIS — I2602 Saddle embolus of pulmonary artery with acute cor pulmonale: Secondary | ICD-10-CM

## 2019-12-12 LAB — CBC
HCT: 50 % (ref 39.0–52.0)
Hemoglobin: 16.5 g/dL (ref 13.0–17.0)
MCH: 28.3 pg (ref 26.0–34.0)
MCHC: 33 g/dL (ref 30.0–36.0)
MCV: 85.6 fL (ref 80.0–100.0)
Platelets: 171 10*3/uL (ref 150–400)
RBC: 5.84 MIL/uL — ABNORMAL HIGH (ref 4.22–5.81)
RDW: 13.5 % (ref 11.5–15.5)
WBC: 7.9 10*3/uL (ref 4.0–10.5)
nRBC: 0 % (ref 0.0–0.2)

## 2019-12-12 LAB — RESPIRATORY PANEL BY RT PCR (FLU A&B, COVID)
Influenza A by PCR: NEGATIVE
Influenza B by PCR: NEGATIVE
SARS Coronavirus 2 by RT PCR: POSITIVE — AB

## 2019-12-12 LAB — BASIC METABOLIC PANEL
Anion gap: 17 — ABNORMAL HIGH (ref 5–15)
BUN: 16 mg/dL (ref 6–20)
CO2: 16 mmol/L — ABNORMAL LOW (ref 22–32)
Calcium: 8.7 mg/dL — ABNORMAL LOW (ref 8.9–10.3)
Chloride: 110 mmol/L (ref 98–111)
Creatinine, Ser: 1.3 mg/dL — ABNORMAL HIGH (ref 0.61–1.24)
GFR calc Af Amer: >60 mL/min (ref >60)
GFR calc non Af Amer: 59 mL/min — ABNORMAL LOW (ref >60)
Glucose, Bld: 130 mg/dL — ABNORMAL HIGH (ref 70–99)
Potassium: 3.8 mmol/L (ref 3.5–5.1)
Sodium: 143 mmol/L (ref 135–145)

## 2019-12-12 LAB — PROTEIN / CREATININE RATIO, URINE
Creatinine, Urine: 216.56 mg/dL
Protein Creatinine Ratio: 0.37 mg/mg{Cre} — ABNORMAL HIGH (ref 0.00–0.15)
Total Protein, Urine: 80 mg/dL

## 2019-12-12 LAB — HIV ANTIBODY (ROUTINE TESTING W REFLEX): HIV Screen 4th Generation wRfx: NONREACTIVE

## 2019-12-12 LAB — HEPARIN LEVEL (UNFRACTIONATED)
Heparin Unfractionated: 0.34 IU/mL (ref 0.30–0.70)
Heparin Unfractionated: 0.4 IU/mL (ref 0.30–0.70)

## 2019-12-12 LAB — C-REACTIVE PROTEIN: CRP: 16.2 mg/dL — ABNORMAL HIGH (ref <1.0)

## 2019-12-12 LAB — ECHOCARDIOGRAM COMPLETE
Height: 72 in
Weight: 3781.33 oz

## 2019-12-12 LAB — MAGNESIUM: Magnesium: 2.2 mg/dL (ref 1.7–2.4)

## 2019-12-12 LAB — TROPONIN I (HIGH SENSITIVITY)
Troponin I (High Sensitivity): 113 ng/L (ref <18)
Troponin I (High Sensitivity): 128 ng/L (ref <18)

## 2019-12-12 LAB — D-DIMER, QUANTITATIVE: D-Dimer, Quant: 5.76 ug/mL-FEU — ABNORMAL HIGH (ref 0.00–0.50)

## 2019-12-12 LAB — LACTATE DEHYDROGENASE: LDH: 242 U/L — ABNORMAL HIGH (ref 98–192)

## 2019-12-12 LAB — PHOSPHORUS: Phosphorus: 3.2 mg/dL (ref 2.5–4.6)

## 2019-12-12 LAB — SODIUM, URINE, RANDOM: Sodium, Ur: 66 mmol/L

## 2019-12-12 MED ORDER — SODIUM CHLORIDE 0.9 % IV SOLN
100.0000 mg | Freq: Every day | INTRAVENOUS | Status: AC
  Administered 2019-12-13 – 2019-12-16 (×4): 100 mg via INTRAVENOUS
  Filled 2019-12-12 (×4): qty 20

## 2019-12-12 MED ORDER — HYDROCODONE-ACETAMINOPHEN 5-325 MG PO TABS
1.0000 | ORAL_TABLET | Freq: Four times a day (QID) | ORAL | Status: DC | PRN
  Administered 2019-12-12: 1 via ORAL
  Filled 2019-12-12: qty 1

## 2019-12-12 MED ORDER — SODIUM CHLORIDE 0.9 % IV SOLN: INTRAVENOUS | Status: DC

## 2019-12-12 MED ORDER — ONDANSETRON HCL 4 MG/2ML IJ SOLN
4.0000 mg | Freq: Four times a day (QID) | INTRAMUSCULAR | Status: DC | PRN
  Administered 2019-12-12 – 2019-12-13 (×3): 4 mg via INTRAVENOUS
  Filled 2019-12-12 (×3): qty 2

## 2019-12-12 MED ORDER — DM-GUAIFENESIN ER 30-600 MG PO TB12
1.0000 | ORAL_TABLET | Freq: Two times a day (BID) | ORAL | Status: DC | PRN
  Administered 2019-12-12: 1 via ORAL
  Filled 2019-12-12: qty 1

## 2019-12-12 MED ORDER — ALBUTEROL SULFATE HFA 108 (90 BASE) MCG/ACT IN AERS
1.0000 | INHALATION_SPRAY | Freq: Four times a day (QID) | RESPIRATORY_TRACT | Status: DC | PRN
  Administered 2019-12-13: 1 via RESPIRATORY_TRACT
  Filled 2019-12-12: qty 6.7

## 2019-12-12 MED ORDER — SODIUM CHLORIDE 0.9 % IV SOLN
200.0000 mg | Freq: Once | INTRAVENOUS | Status: AC
  Administered 2019-12-12: 200 mg via INTRAVENOUS
  Filled 2019-12-12: qty 40

## 2019-12-12 MED ORDER — CHLORHEXIDINE GLUCONATE CLOTH 2 % EX PADS
6.0000 | MEDICATED_PAD | Freq: Every day | CUTANEOUS | Status: DC
  Administered 2019-12-12 – 2019-12-15 (×4): 6 via TOPICAL

## 2019-12-12 NOTE — Progress Notes (Signed)
ANTICOAGULATION CONSULT NOTE - Follow Up Consult  Pharmacy Consult for Heparin Indication: pulmonary embolus and DVT  No Known Allergies  Patient Measurements: Height: 6' (182.9 cm) Weight: 236 lb 5.3 oz (107.2 kg) IBW/kg (Calculated) : 77.6 Heparin Dosing Weight:   Vital Signs: Temp: 97.8 F (36.6 C) (02/26 0155) Temp Source: Oral (02/26 0155) BP: 117/83 (02/26 0155) Pulse Rate: 119 (02/26 0155)  Labs: Recent Labs    12/11/19 1656 12/11/19 1855 12/12/19 0155  HGB 18.0*  --  16.5  HCT 53.8*  --  50.0  PLT 149*  --  171  APTT 29  --   --   LABPROT 13.1  --   --   INR 1.0  --   --   HEPARINUNFRC  --   --  0.34  CREATININE 1.36*  --  1.30*  TROPONINIHS 197* 302*  --     Estimated Creatinine Clearance: 76.4 mL/min (A) (by C-G formula based on SCr of 1.3 mg/dL (H)).   Medications:  Infusions:  . sodium chloride 100 mL/hr at 12/12/19 0300  . heparin 1,700 Units/hr (12/12/19 0300)    Assessment: Patient with heparin level at goal.  No heparin issues noted.  Goal of Therapy:  Heparin level 0.3-0.7 units/ml Monitor platelets by anticoagulation protocol: Yes   Plan:  Continue heparin drip at current rate Recheck level at 1000  Darlina Guys, [place] Crowford 12/12/2019,4:33 AM

## 2019-12-12 NOTE — Progress Notes (Signed)
  Echocardiogram 2D Echocardiogram has been performed.  Duane Gregory 12/12/2019, 9:08 AM

## 2019-12-12 NOTE — Consult Note (Addendum)
NAME:  Duane Gregory, MRN:  [id number], DOB:  December 20, 1960, LOS: 1 ADMISSION DATE:  12/11/2019, CONSULTATION DATE: 12/12/2019 REFERRING MD:  Dr. Sunnie Nielsen, CHIEF COMPLAINT:  PE with right heart strain   Brief History   59yo male admitted 12/11/2019 with  diagnosis of COVID 2 weeks prior to admission who presented with worsening shortness of breath. CT chest on admisson positive submassive PE with right heart strain, PCCM consulted for assistance in management.   History of present illness   Duane Gregory is a 59yo male with past medical history significant for nephrolithiasis and pulmonary embolism 2 years ago previously on anticoagulation but discontinued December 2020 who was recently diagnosed with Covid 2 weeks prior to admission.  Who presented to the emergency department 2/25 due to worsening and progressive shortness of breath.  CT angio chest done on admission positive for submassive bilateral pulmonary embolism with evidence of right heart strain on CTA.  Lower extremity ultrasound positive for acute DVT in the left femoral vein.  2D echocardiogram obtained which revealed preserved EF of 60 to 65% however right ventricular systolic function is severely reduced, the right ventricle size is severely enlarged but RA pressures felt to be low .  PCCM was consulted to help determine the need of thrombolytic therapy.  Patient reports prior history of PE 2 years ago, chart review revealed prior CTA chest 03/24/2018 which again revealed a large clot burden with bilateral pulmonary embolus with evidence of right heart strain.  Patient states he was on Eliquis after PE but anticoagulation was discontinued in December 2020.  Patient cannot articulate why the decision was made to discontinue anticoagulation.  Past Medical History  Nephrolithiasis  Prior PE with anticoagulation x1 year  Significant Hospital Events   Admitted 2/25  Consults:    Procedures:    Significant Diagnostic Tests:  CTA chest  2/26 >  1. Bilateral acute pulmonary emboli, most pronounced within the right middle lobe, right lower lobe, and left lower lobe. Positive for acute PE with CTevidence of right heart strain (RV/LV Ratio = 1.9) consistent with at least submassive (intermediate risk) PE. The presence of right heart strain has been associated with an increased risk of morbidity and mortality. 2. Left lower lobe atelectasis versus developing infarct. 3. Previously identified sub solid nodule within the left upper lobe is no longer identified. 4. Diffuse fatty infiltration of the liver  Echo 2/26 > 1. Left ventricular ejection fraction, by estimation, is 60 to 65%. The  left ventricle has normal function. The left ventricle has no regional  wall motion abnormalities. There is mild left ventricular hypertrophy.  Left ventricular diastolic parameters  were normal.  2. Right ventricular systolic function is severely reduced. The right  ventricular size is severely enlarged. There is moderately elevated  pulmonary artery systolic pressure.  3. Right atrial size was moderately dilated.  4. The mitral valve is normal in structure and function. No evidence of  mitral valve regurgitation. No evidence of mitral stenosis.  5. The aortic valve is normal in structure and function. Aortic valve  regurgitation is not visualized. No aortic stenosis is present.  6. Aortic dilatation noted. There is mild dilatation of the aortic root  measuring 38 mm.  7. The inferior vena cava is normal in size with greater than 50%  respiratory variability, suggesting right atrial pressure of 3 mmHg.   Micro Data:  Covid 2/26 > POS  Antimicrobials/ antivirals:  Remdesivir 2/26>>>  Interim history/subjective:  Patient seen sitting up in  bed in no acute distress, reports nonproductive cough and increased dyspnea on exertion.  Denies any chest pain/pressure.  Objective   Blood pressure 139/90, pulse (!) 103, temperature 97.6  F (36.4 C), temperature source Axillary, resp. rate (!) 26, height 6' (1.829 m), weight 107.2 kg, SpO2 98 %.        Intake/Output Summary (Last 24 hours) at 12/12/2019 1148 Last data filed at 12/12/2019 1100 Gross per 24 hour  Intake 2220.48 ml  Output --  Net 2220.48 ml   Filed Weights   12/11/19 1548 12/12/19 0155  Weight: 111.1 kg 107.2 kg    Examination: General: Very pleasant middle-aged gentleman, sitting up in bed, in NAD RA with pulse 103 HEENT: Normocephalic/atraumatic, MM pink/moist, PERRL,  Neuro: Alert and oriented x3, nonfocal CV: s1s2 regular rate and rhythm, no murmur, rubs, or gallops,  PULM: Clear to auscultation bilaterally, no increased work of breathing, no accessory muscle use GI: soft, bowel sounds active in all 4 quadrants, non-tender, non-distended Extremities: warm/dry, no edema  Skin: no rashes or lesions   Resolved Hospital Problem list     Assessment & Plan:  Bilateral submassive pulmonary embolism -CT completed on admission consistent with at least submassive pulmonary embolism with CT evidence of right heart strain. -2D echocardiogram reveals preserved EF but evidence of severely reduced right ventricular function and severely enlarged right ventricle -Lower extremity Dopplers positive for DVT in the left femoral vein -PESI score 90 P: Continue systemic heparin Goal INR >  w/ 5 days minimal  Overlap Consider hypercoagulable panel however both DVTs appear provoked in the setting of COVID  Patient appears hemodynamically stable with appropriate oxygenation on room air Coordinated with attending physician, no indications for thrombolytic therapy at this time Well male need lifelong anticoagulation  Clearly he will need lifelong coumadin based on prior h/o DVT His RA is big but pressure is low, he is not tachycardic or hypoxemic or RA so no role for thrombolytics or a filter.     PCCM will sign off. Thank you for the opportunity to  participate in this patient's care. Please contact if we can be of further assistance.   Best practice:  Diet: reg Pain/Anxiety/Delirium protocol (if indicated): N/A VAP protocol (if indicated): N/A DVT prophylaxis: Heparin drip  GI prophylaxis: N/A Glucose control: Monitor Mobility: Up with assistance  Code Status: Full Family Communication: Per primary  Disposition: Stepdown   Labs   CBC: Recent Labs  Lab 12/11/19 1656 12/12/19 0155  WBC 8.1 7.9  NEUTROABS 6.6  --   HGB 18.0* 16.5  HCT 53.8* 50.0  MCV 86.6 85.6  PLT 149* 517    Basic Metabolic Panel: Recent Labs  Lab 12/11/19 1656 12/12/19 0155  NA 141 143  K 4.3 3.8  CL 105 110  CO2 23 16*  GLUCOSE 118* 130*  BUN 15 16  CREATININE 1.36* 1.30*  CALCIUM 9.0 8.7*  MG  --  2.2  PHOS  --  3.2   GFR: Estimated Creatinine Clearance: 76.4 mL/min (A) (by C-G formula based on SCr of 1.3 mg/dL (H)). Recent Labs  Lab 12/11/19 1656 12/12/19 0155  WBC 8.1 7.9    Liver Function Tests: No results for input(s): AST, ALT, ALKPHOS, BILITOT, PROT, ALBUMIN in the last 168 hours. No results for input(s): LIPASE, AMYLASE in the last 168 hours. No results for input(s): AMMONIA in the last 168 hours.  ABG No results found for: PHART, PCO2ART, PO2ART, HCO3, TCO2, ACIDBASEDEF, O2SAT   Coagulation Profile:  Recent Labs  Lab 12/11/19 1656  INR 1.0    Cardiac Enzymes: No results for input(s): CKTOTAL, CKMB, CKMBINDEX, TROPONINI in the last 168 hours.  HbA1C: No results found for: HGBA1C  CBG: No results for input(s): GLUCAP in the last 168 hours.  Review of Systems: Positive in bold   Gen: Denies fever, chills, weight change, fatigue, night sweats HEENT: Denies blurred vision, double vision, hearing loss, tinnitus, sinus congestion, rhinorrhea, sore throat, neck stiffness, dysphagia PULM: Denies shortness of breath, cough, sputum production, hemoptysis, wheezing CV: Denies chest pain, edema, orthopnea, paroxysmal  nocturnal dyspnea, palpitations GI: Denies abdominal pain, nausea, vomiting, diarrhea, hematochezia, melena, constipation, change in bowel habits GU: Denies dysuria, hematuria, polyuria, oliguria, urethral discharge Endocrine: Denies hot or cold intolerance, polyuria, polyphagia or appetite change Derm: Denies rash, dry skin, scaling or peeling skin change Heme: Denies easy bruising, bleeding, bleeding gums Neuro: Denies headache, numbness, weakness, slurred speech, loss of memory or consciousness  Past Medical History  He,  has a past medical history of History of kidney stones.   Surgical History    Past Surgical History:  Procedure Laterality Date  . CYSTOSCOPY/RETROGRADE/URETEROSCOPY/STONE EXTRACTION WITH BASKET Left 02/20/2018   Procedure: CYSTOSCOPY/RETROGRADE/URETEROSCOPY/STONE EXTRACTION WITH BASKET LEFT/LASER AND  STENT PLACEMENT;  Surgeon: Heloise Purpura, MD;  Location: [facility];  Service: Urology;  Laterality: Left;  . EXTRACORPOREAL SHOCK WAVE LITHOTRIPSY Left 02/14/2018   Procedure: LEFT EXTRACORPOREAL SHOCK WAVE LITHOTRIPSY (ESWL);  Surgeon: Jerilee Field, MD;  Location: [facility];  Service: Urology;  Laterality: Left;  . HOLMIUM LASER APPLICATION N/A 02/20/2018   Procedure: HOLMIUM LASER APPLICATION;  Surgeon: Heloise Purpura, MD;  Location: [facility];  Service: Urology;  Laterality: N/A;  . KNEE SURGERY       Social History   reports that he has never smoked. He has never used smokeless tobacco. He reports current alcohol use. He reports that he does not use drugs.   Family History   His family history is not on file.   Allergies No Known Allergies   Home Medications  Prior to Admission medications   Medication Sig Start Date End Date Taking? Authorizing Provider  ELIQUIS 5 MG TABS tablet TAKE 1 TABLET BY MOUTH TWICE A DAY Patient not taking: Reported on 12/11/2019 07/14/19   Leslye Peer, MD     Signature   Delfin Gant, NP-C [facility] Contact / Pager information can be found on Amion  12/12/2019, 2:16 PM   I, Dr. Suzie Portela, have personally reviewed patient's available data, including medical history, events of note, physical examination and test results as part of my evaluation. I have discussed with NP Earlene Plater   and other care providers all aspects of the patients PCCM issues as listed.  In addition,  I have personally evaluated the patient and assisted in the formulation of the management plan as above.    Sandrea Hughs, MD Pulmonary and Critical Care Medicine [facility] Cell [phone] After 5:30 PM or weekends, use Beeper [phone]

## 2019-12-12 NOTE — Progress Notes (Signed)
ANTICOAGULATION CONSULT NOTE - Follow Up Consult  Pharmacy Consult for Heparin Indication: pulmonary embolus and DVT  No Known Allergies  Patient Measurements: Height: 6' (182.9 cm) Weight: 236 lb 5.3 oz (107.2 kg) IBW/kg (Calculated) : 77.6 HEPARIN DW (KG): 100.1   Vital Signs: Temp: 97.6 F (36.4 C) (02/26 0800) Temp Source: Axillary (02/26 0800) BP: 139/90 (02/26 0900) Pulse Rate: 103 (02/26 0900)  Labs: Recent Labs    12/11/19 1656 12/11/19 1855 12/12/19 0155 12/12/19 0949  HGB 18.0*  --  16.5  --   HCT 53.8*  --  50.0  --   PLT 149*  --  171  --   APTT 29  --   --   --   LABPROT 13.1  --   --   --   INR 1.0  --   --   --   HEPARINUNFRC  --   --  0.34 0.40  CREATININE 1.36*  --  1.30*  --   TROPONINIHS 197* 302*  --  113*    Estimated Creatinine Clearance: 76.4 mL/min (A) (by C-G formula based on SCr of 1.3 mg/dL (H)).   Medications:  Infusions:  . sodium chloride 100 mL/hr at 12/12/19 1013  . heparin 1,700 Units/hr (12/12/19 1000)  . remdesivir 200 mg in sodium chloride 0.9% 250 mL IVPB     Followed by  . [START ON 12/13/2019] remdesivir 100 mg in NS 100 mL     No anticoag PTA  Assessment: 58 y.o.male with chief complaint shortness of breath and leg swelling. COVID infection diagnosed 2 weeks ago. Venous duplex consistent with LLE DVT and CT showed bilateral acute PE with evidence of right heart strain.  PMH significant for saddle PE in 2019.  He was on Eliquis x2 years but stopped anticoagulation in Dec 2020.  Baseline labs: Hgb 18 and plts 149. INR, aPTT at baseline.  Pharmacy has been consulted for IV heparin dosing.   12/12/2019:  Heparin level therapeutic (0.4) on IV heparin at 1700 units/hr  CBC: WNL  No bleeding or infusion related issues reported by RN  Goal of Therapy:  Heparin level 0.3-0.7 units/ml Monitor platelets by anticoagulation protocol: Yes   Plan:  Continue heparin drip at current rate Daily heparin level & CBC while on  heparin F/U plans for long-term anticoagulation  Junita Push PharmD, BCPS 12/12/2019,11:32 AM

## 2019-12-12 NOTE — Progress Notes (Signed)
PROGRESS NOTE    Duane Gregory  HRC:[id number] DOB: 14-Dec-1960 DOA: 12/11/2019 PCP: Richmond Campbell., PA-C   Brief Narrative: 59 year old with past medical history significant for nephrolithiasis, pulmonary embolism diagnosed 2 years ago and was on anticoagulation for 2 years.  He completed his treatment on December 2020.  2 weeks ago patient was diagnosed with Covid infection that was managed supportively at home.  Over the course of 2 weeks he has developed progressive shortness of breath leading to his current presentation to the hospital.  CT chest done showed submassive bilateral PE with right heart strain.  Doppler lower extremity PVR acute left femoral DVT.     Assessment & Plan:   Active Problems:   Acute pulmonary embolism (HCC)   1-Submassive PE; right side heart strain; Echocardiogram shows severely reduced right ventricular function.  CCM consulted. Troponin trending down. Continue with heparin drip. Vital stable.  Oxygen sat 97 on room air  2-Viral COVID-19 infection: First few days of diagnosis patient was having subjective fever, cough, muscle pain. He continues to have mild cough.  Yesterday he developed worsening shortness of breath. -He was diagnosed with Covid on February 10. Day 16 of infection.  -CRP is elevated 16, D-dimer has decreased to 5.7. -I will start Remdesivir.  -His oxygen sat has been above 94, if drops will need to start IV steroids.   3-Metabolic Acidosis; he report diarrhea.  Continue with IV fluids.   4-Left femoral DVT; on heparin Gtt.  5-AKI; continue with IV fluids.   6-Diarrhea; likely related to Covid.   Estimated body mass index is 32.05 kg/m as calculated from the following:   Height as of this encounter: 6' (1.829 m).   Weight as of this encounter: 107.2 kg.   DVT prophylaxis: Heparin Gtt Code Status: Full code Family Communication: Care discussed with patient.  Disposition Plan:  Patient is from: Home  Anticipated  d/c date: 5 days  Barriers to d/c or necessity for inpatient status: submassive PE, requiring Heparin Gtt  Consultants:   CCM  Procedures:   ECHO  Antimicrobials:    Subjective: He is breathing somewhat better. Paroxysmal coughing spell.  Denies chest pain   Objective: Vitals:   12/12/19 0030 12/12/19 0155 12/12/19 0400 12/12/19 0800  BP: 110/88 117/83 108/81 (!) 111/95  Pulse: (!) 118 (!) 119 (!) 107 (!) 107  Resp: (!) 24 20 20  (!) 25  Temp: 98.3 F (36.8 C) 97.8 F (36.6 C)  97.6 F (36.4 C)  TempSrc:  Oral  Axillary  SpO2: 94% 94% 91% 97%  Weight:  107.2 kg    Height:  6' (1.829 m)      Intake/Output Summary (Last 24 hours) at 12/12/2019 0836 Last data filed at 12/12/2019 0600 Gross per 24 hour  Intake 1637.36 ml  Output --  Net 1637.36 ml   Filed Weights   12/11/19 1548 12/12/19 0155  Weight: 111.1 kg 107.2 kg    Examination:  General exam: Appears calm and comfortable  Respiratory system: sporadic ronchus. Respiratory effort normal. Cardiovascular system: S1 & S2 heard, RRR. No JVD, murmurs, rubs, gallops or clicks. No pedal edema. Gastrointestinal system: Abdomen is nondistended, soft and nontender. No organomegaly or masses felt. Normal bowel sounds heard. Central nervous system: Alert and oriented. No focal neurological deficits. Extremities: Symmetric 5 x 5 power. Skin: No rashes, lesions or ulcers   Data Reviewed: I have personally reviewed following labs and imaging studies  CBC: Recent Labs  Lab 12/11/19 1656 12/12/19 0155  WBC 8.1 7.9  NEUTROABS 6.6  --   HGB 18.0* 16.5  HCT 53.8* 50.0  MCV 86.6 85.6  PLT 149* 171   Basic Metabolic Panel: Recent Labs  Lab 12/11/19 1656 12/12/19 0155  NA 141 143  K 4.3 3.8  CL 105 110  CO2 23 16*  GLUCOSE 118* 130*  BUN 15 16  CREATININE 1.36* 1.30*  CALCIUM 9.0 8.7*  MG  --  2.2  PHOS  --  3.2   GFR: Estimated Creatinine Clearance: 76.4 mL/min (A) (by C-G formula based on SCr of 1.3  mg/dL (H)). Liver Function Tests: No results for input(s): AST, ALT, ALKPHOS, BILITOT, PROT, ALBUMIN in the last 168 hours. No results for input(s): LIPASE, AMYLASE in the last 168 hours. No results for input(s): AMMONIA in the last 168 hours. Coagulation Profile: Recent Labs  Lab 12/11/19 1656  INR 1.0   Cardiac Enzymes: No results for input(s): CKTOTAL, CKMB, CKMBINDEX, TROPONINI in the last 168 hours. BNP (last 3 results) No results for input(s): PROBNP in the last 8760 hours. HbA1C: No results for input(s): HGBA1C in the last 72 hours. CBG: No results for input(s): GLUCAP in the last 168 hours. Lipid Profile: No results for input(s): CHOL, HDL, LDLCALC, TRIG, CHOLHDL, LDLDIRECT in the last 72 hours. Thyroid Function Tests: No results for input(s): TSH, T4TOTAL, FREET4, T3FREE, THYROIDAB in the last 72 hours. Anemia Panel: No results for input(s): VITAMINB12, FOLATE, FERRITIN, TIBC, IRON, RETICCTPCT in the last 72 hours. Sepsis Labs: No results for input(s): PROCALCITON, LATICACIDVEN in the last 168 hours.  Recent Results (from the past 240 hour(s))  Respiratory Panel by RT PCR (Flu A&B, Covid) - Nasopharyngeal Swab     Status: Abnormal   Collection Time: 12/12/19 12:24 AM   Specimen: Nasopharyngeal Swab  Result Value Ref Range Status   SARS Coronavirus 2 by RT PCR POSITIVE (A) NEGATIVE Final    Comment: RESULT CALLED TO, READ BACK BY AND VERIFIED WITH: ALLIE FULLERTON @ 0245 ON 12/12/19 C VARNER (NOTE) SARS-CoV-2 target nucleic acids are DETECTED. SARS-CoV-2 RNA is generally detectable in upper respiratory specimens  during the acute phase of infection. Positive results are indicative of the presence of the identified virus, but do not rule out bacterial infection or co-infection with other pathogens not detected by the test. Clinical correlation with patient history and other diagnostic information is necessary to determine patient infection status. The expected result  is Negative. Fact Sheet for Patients:  https://www.moore.com/ Fact Sheet for Healthcare Providers: https://www.young.biz/ This test is not yet approved or cleared by the [place] FDA and  has been authorized for detection and/or diagnosis of SARS-CoV-2 by FDA under an Emergency Use Authorization (EUA).  This EUA will remain in effect (meaning this test can be  used) for the duration of  the COVID-19 declaration under Section 564(b)(1) of the Act, 21 U.S.C. section 360bbb-3(b)(1), unless the authorization is terminated or revoked sooner.    Influenza A by PCR NEGATIVE NEGATIVE Final   Influenza B by PCR NEGATIVE NEGATIVE Final    Comment: (NOTE) The Xpert Xpress SARS-CoV-2/FLU/RSV assay is intended as an aid in  the diagnosis of influenza from Nasopharyngeal swab specimens and  should not be used as a sole basis for treatment. Nasal washings and  aspirates are unacceptable for Xpert Xpress SARS-CoV-2/FLU/RSV  testing. Fact Sheet for Patients: https://www.moore.com/ Fact Sheet for Healthcare Providers: https://www.young.biz/ This test is not yet approved or cleared by the [place] FDA and  has been  authorized for detection and/or diagnosis of SARS-CoV-2 by  FDA under an Emergency Use Authorization (EUA). This EUA will remain  in effect (meaning this test can be used) for the duration of the  Covid-19 declaration under Section 564(b)(1) of the Act, 21  U.S.C. section 360bbb-3(b)(1), unless the authorization is  terminated or revoked. Performed at [facility], [address] [place], [place] [address]          Radiology Studies: CT Angio Chest PE W and/or Wo Contrast  Result Date: 12/11/2019 CLINICAL DATA:  Short of breath, chest pain, history of pulmonary embolus EXAM: CT ANGIOGRAPHY CHEST WITH CONTRAST TECHNIQUE: Multidetector CT imaging of the chest was performed using  the standard protocol during bolus administration of intravenous contrast. Multiplanar CT image reconstructions and MIPs were obtained to evaluate the vascular anatomy. CONTRAST:  OMNIPAQUE IOHEXOL 350 MG/ML SOLN COMPARISON:  07/10/2018 FINDINGS: Cardiovascular: This is a technically adequate evaluation of the pulmonary vasculature. Bilateral acute pulmonary emboli are seen, most pronounced within the right middle lobe, right lower lobe, and left lower lobe. Right ventricle is dilated consistent with right heart strain, RV/LV ratio measuring 1.9. Heart is not enlarged. No pericardial effusion. Thoracic aorta is normal in caliber. Mediastinum/Nodes: No enlarged mediastinal, hilar, or axillary lymph nodes. Thyroid gland, trachea, and esophagus demonstrate no significant findings. Lungs/Pleura: Dependent linear consolidation is seen within the left lower lobe, compatible with either hypoventilatory change or less likely developing infarct. No other consolidation, effusion, or pneumothorax. Central airways are patent. The sub solid nodule within the left upper lobe along the major fissure seen previously is no longer identified. No new pulmonary nodules. Upper Abdomen: There is diffuse fatty infiltration of the liver. Otherwise no acute abnormalities. Musculoskeletal: No acute or destructive bony lesions. Reconstructed images demonstrate no additional findings. Review of the MIP images confirms the above findings. IMPRESSION: 1. Bilateral acute pulmonary emboli, most pronounced within the right middle lobe, right lower lobe, and left lower lobe. Positive for acute PE with CTevidence of right heart strain (RV/LV Ratio = 1.9) consistent with at least submassive (intermediate risk) PE. The presence of right heart strain has been associated with an increased risk of morbidity and mortality. 2. Left lower lobe atelectasis versus developing infarct. 3. Previously identified sub solid nodule within the left upper lobe is  no longer identified. 4. Diffuse fatty infiltration of the liver. These results were called by telephone at the time of interpretation on 12/11/2019 at 7:58 pm to provider [facility] , who verbally acknowledged these results. Electronically Signed   By: Sharlet Salina M.D.   On: 12/11/2019 19:58   DG Chest Portable 1 View  Result Date: 12/11/2019 CLINICAL DATA:  Shortness of breath.  COVID diagnosed on 11/28/2019. EXAM: PORTABLE CHEST 1 VIEW COMPARISON:  Chest x-ray dated 03/24/2018 FINDINGS: The heart size and mediastinal contours are within normal limits. Both lungs are clear. The visualized skeletal structures are unremarkable. IMPRESSION: Normal exam. Electronically Signed   By: Francene Boyers M.D.   On: 12/11/2019 17:00   VAS [place] LOWER EXTREMITY VENOUS (DVT) (ONLY [facility] & [facility])  Result Date: 12/11/2019  Lower Venous DVTStudy Indications: Swelling.  Risk Factors: COVID 19 positive. Comparison Study: No prior studies. Performing Technologist: Chanda Busing RVT  Examination Guidelines: A complete evaluation includes B-mode imaging, spectral Doppler, color Doppler, and power Doppler as needed of all accessible portions of each vessel. Bilateral testing is considered an integral part of a complete examination. Limited examinations for reoccurring indications  may be performed as noted. The reflux portion of the exam is performed with the patient in reverse Trendelenburg.  +---------+---------------+---------+-----------+----------+--------------+ RIGHT    CompressibilityPhasicitySpontaneityPropertiesThrombus Aging +---------+---------------+---------+-----------+----------+--------------+ CFV      Full           Yes      Yes                                 +---------+---------------+---------+-----------+----------+--------------+ SFJ      Full                                                        +---------+---------------+---------+-----------+----------+--------------+ FV Prox  Full                                                         +---------+---------------+---------+-----------+----------+--------------+ FV Mid   Full                                                        +---------+---------------+---------+-----------+----------+--------------+ FV DistalFull                                                        +---------+---------------+---------+-----------+----------+--------------+ PFV      Full                                                        +---------+---------------+---------+-----------+----------+--------------+ POP      Full           Yes      Yes                                 +---------+---------------+---------+-----------+----------+--------------+ PTV      Full                                                        +---------+---------------+---------+-----------+----------+--------------+ PERO     Full                                                        +---------+---------------+---------+-----------+----------+--------------+   +---------+---------------+---------+-----------+----------+--------------+ LEFT     CompressibilityPhasicitySpontaneityPropertiesThrombus Aging +---------+---------------+---------+-----------+----------+--------------+ CFV      Full  Yes      Yes                                 +---------+---------------+---------+-----------+----------+--------------+ SFJ      Full                                                        +---------+---------------+---------+-----------+----------+--------------+ FV Prox  Full                                                        +---------+---------------+---------+-----------+----------+--------------+ FV Mid   Full                                                        +---------+---------------+---------+-----------+----------+--------------+ FV DistalPartial        Yes      Yes                   Acute          +---------+---------------+---------+-----------+----------+--------------+ PFV      Full                                                        +---------+---------------+---------+-----------+----------+--------------+ POP      Full           Yes      Yes                                 +---------+---------------+---------+-----------+----------+--------------+ PTV      Full                                                        +---------+---------------+---------+-----------+----------+--------------+ PERO     Full                                                        +---------+---------------+---------+-----------+----------+--------------+     Summary: RIGHT: - There is no evidence of deep vein thrombosis in the lower extremity.  - No cystic structure found in the popliteal fossa.  LEFT: - Findings consistent with acute deep vein thrombosis involving the left femoral vein. - No cystic structure found in the popliteal fossa.  *See table(s) above for measurements and observations.    Preliminary         Scheduled Meds: . Chlorhexidine Gluconate Cloth  6 each Topical Daily  Continuous Infusions: . sodium chloride    . heparin 1,700 Units/hr (12/12/19 0600)     LOS: 1 day    Time spent: 35 minutes    Delani Kohli A Erion Weightman, MD [facility]   If 7PM-7AM, please contact night-coverage [email or web address]  12/12/2019, 8:36 AM

## 2019-12-12 NOTE — ED Notes (Signed)
ED TO INPATIENT HANDOFF REPORT  Name/Age/Gender Duane Gregory 59 y.o. male  Code Status Code Status History    Date Active Date Inactive Code Status Order ID Comments User Context   03/24/2018 2006 03/27/2018 1809 Full Code [id number]  Elder Love, MD Inpatient   Advance Care Planning Activity      Home/SNF/Other Home  Chief Complaint Acute pulmonary embolism (HCC) [I26.99]  Level of Care/Admitting Diagnosis ED Disposition    ED Disposition Condition Comment   Admit  Hospital Area: [facility] [[id number]]  Level of Care: Stepdown [14]  Admit to SDU based on following criteria: Hemodynamic compromise or significant risk of instability:  Patient requiring short term acute titration and management of vasoactive drips, and invasive monitoring (i.e., CVP and Arterial line).  May admit patient to [facility] or [facility] if equivalent level of care is available:: No  Covid Evaluation: Confirmed COVID Positive  Diagnosis: Acute pulmonary embolism [facility]) [[id number]]  Admitting Physician: Woody Seller  Attending Physician: Berton Mount I [[id number]]  Estimated length of stay: 5 - 7 days  Certification:: I certify this patient will need inpatient services for at least 2 midnights       Medical History Past Medical History:  Diagnosis Date  . History of kidney stones     Allergies No Known Allergies  IV Location/Drains/Wounds Patient Lines/Drains/Airways Status   Active Line/Drains/Airways    Name:   Placement date:   Placement time:   Site:   Days:   Peripheral IV 12/11/19 Left;Posterior Hand   12/11/19    --    Hand   1   Peripheral IV 12/11/19 Right Antecubital   12/11/19    1653    Antecubital   1   Ureteral Drain/Stent Left ureter 6 Fr.   02/20/18    1850    Left ureter   660          Labs/Imaging Results for orders placed or performed during the hospital encounter of 12/11/19 (from the past 48 hour(s))  CBC with Differential      Status: Abnormal   Collection Time: 12/11/19  4:56 PM  Result Value Ref Range   WBC 8.1 4.0 - 10.5 K/uL   RBC 6.21 (H) 4.22 - 5.81 MIL/uL   Hemoglobin 18.0 (H) 13.0 - 17.0 g/dL   HCT 72.5 (H) 36.6 - 44.0 %   MCV 86.6 80.0 - 100.0 fL   MCH 29.0 26.0 - 34.0 pg   MCHC 33.5 30.0 - 36.0 g/dL   RDW 34.7 42.5 - 95.6 %   Platelets 149 (L) 150 - 400 K/uL   nRBC 0.0 0.0 - 0.2 %   Neutrophils Relative % 81 %   Neutro Abs 6.6 1.7 - 7.7 K/uL   Lymphocytes Relative 8 %   Lymphs Abs 0.6 (L) 0.7 - 4.0 K/uL   Monocytes Relative 10 %   Monocytes Absolute 0.8 0.1 - 1.0 K/uL   Eosinophils Relative 0 %   Eosinophils Absolute 0.0 0.0 - 0.5 K/uL   Basophils Relative 0 %   Basophils Absolute 0.0 0.0 - 0.1 K/uL   Immature Granulocytes 1 %   Abs Immature Granulocytes 0.04 0.00 - 0.07 K/uL    Comment: Performed at [facility], [address], [place], [place] [address]  Basic metabolic panel     Status: Abnormal   Collection Time: 12/11/19  4:56 PM  Result Value Ref Range   Sodium 141 135 - 145 mmol/L  Potassium 4.3 3.5 - 5.1 mmol/L   Chloride 105 98 - 111 mmol/L   CO2 23 22 - 32 mmol/L   Glucose, Bld 118 (H) 70 - 99 mg/dL    Comment: Glucose reference range applies only to samples taken after fasting for at least 8 hours.   BUN 15 6 - 20 mg/dL   Creatinine, Ser 9.79 (H) 0.61 - 1.24 mg/dL   Calcium 9.0 8.9 - 89.2 mg/dL   GFR calc non Af Amer 56 (L) >60 mL/min   GFR calc Af Amer >60 >60 mL/min   Anion gap 13 5 - 15    Comment: Performed at [facility], [address] [place], [place] [address]  Troponin I (High Sensitivity)     Status: Abnormal   Collection Time: 12/11/19  4:56 PM  Result Value Ref Range   Troponin I (High Sensitivity) 197 (HH) <18 ng/L    Comment: CRITICAL RESULT CALLED TO, READ BACK BY AND VERIFIED WITH: C.SIMPSON AT 1853 ON 12/11/19 BY N.THOMPSON (NOTE) Elevated high sensitivity troponin I (hsTnI) values and significant  changes  across serial measurements may suggest ACS but many other  chronic and acute conditions are known to elevate hsTnI results.  Refer to the Links section for chest pain algorithms and additional  guidance. Performed at [facility], [address], [place], [place] [address]   D-dimer, quantitative (not at [facility])     Status: Abnormal   Collection Time: 12/11/19  4:56 PM  Result Value Ref Range   D-Dimer, Quant >20.00 (H) 0.00 - 0.50 ug/mL-FEU    Comment: (NOTE) At the manufacturer cut-off of 0.50 ug/mL FEU, this assay has been documented to exclude PE with a sensitivity and negative predictive value of 97 to 99%.  At this time, this assay has not been approved by the FDA to exclude DVT/VTE. Results should be correlated with clinical presentation. Performed at [facility], [address] [place], [place] [address]   Protime-INR     Status: None   Collection Time: 12/11/19  4:56 PM  Result Value Ref Range   Prothrombin Time 13.1 11.4 - 15.2 seconds   INR 1.0 0.8 - 1.2    Comment: (NOTE) INR goal varies based on device and disease states. Performed at [facility], [address], [place], [place] [address]   APTT     Status: None   Collection Time: 12/11/19  4:56 PM  Result Value Ref Range   aPTT 29 24 - 36 seconds    Comment: Performed at [facility], [address], [place], [place] [address]  Brain natriuretic peptide     Status: Abnormal   Collection Time: 12/11/19  4:56 PM  Result Value Ref Range   B Natriuretic Peptide 112.6 (H) 0.0 - 100.0 pg/mL    Comment: Performed at [facility], [address] [place], [place] [address]  Troponin I (High Sensitivity)     Status: Abnormal   Collection Time: 12/11/19  6:55 PM  Result Value Ref Range   Troponin I (High Sensitivity) 302 (HH) <18 ng/L    Comment: DELTA CHECK NOTED CRITICAL RESULT CALLED TO, READ BACK BY AND VERIFIED  WITH: TALKINGTON,J @ 2123 ON [id number] BY POTEAT,S (NOTE) Elevated high sensitivity troponin I (hsTnI) values and significant  changes across serial measurements may suggest ACS but many other  chronic and acute conditions are known to elevate hsTnI results.  Refer to the Links  section for chest pain algorithms and additional  guidance. Performed at [facility], [address], [place], [place] [address]    CT Angio Chest PE W and/or Wo Contrast  Result Date: 12/11/2019 CLINICAL DATA:  Short of breath, chest pain, history of pulmonary embolus EXAM: CT ANGIOGRAPHY CHEST WITH CONTRAST TECHNIQUE: Multidetector CT imaging of the chest was performed using the standard protocol during bolus administration of intravenous contrast. Multiplanar CT image reconstructions and MIPs were obtained to evaluate the vascular anatomy. CONTRAST:  16mL OMNIPAQUE IOHEXOL 350 MG/ML SOLN COMPARISON:  07/10/2018 FINDINGS: Cardiovascular: This is a technically adequate evaluation of the pulmonary vasculature. Bilateral acute pulmonary emboli are seen, most pronounced within the right middle lobe, right lower lobe, and left lower lobe. Right ventricle is dilated consistent with right heart strain, RV/LV ratio measuring 1.9. Heart is not enlarged. No pericardial effusion. Thoracic aorta is normal in caliber. Mediastinum/Nodes: No enlarged mediastinal, hilar, or axillary lymph nodes. Thyroid gland, trachea, and esophagus demonstrate no significant findings. Lungs/Pleura: Dependent linear consolidation is seen within the left lower lobe, compatible with either hypoventilatory change or less likely developing infarct. No other consolidation, effusion, or pneumothorax. Central airways are patent. The sub solid nodule within the left upper lobe along the major fissure seen previously is no longer identified. No new pulmonary nodules. Upper Abdomen: There is diffuse fatty infiltration of the liver. Otherwise no acute  abnormalities. Musculoskeletal: No acute or destructive bony lesions. Reconstructed images demonstrate no additional findings. Review of the MIP images confirms the above findings. IMPRESSION: 1. Bilateral acute pulmonary emboli, most pronounced within the right middle lobe, right lower lobe, and left lower lobe. Positive for acute PE with CTevidence of right heart strain (RV/LV Ratio = 1.9) consistent with at least submassive (intermediate risk) PE. The presence of right heart strain has been associated with an increased risk of morbidity and mortality. 2. Left lower lobe atelectasis versus developing infarct. 3. Previously identified sub solid nodule within the left upper lobe is no longer identified. 4. Diffuse fatty infiltration of the liver. These results were called by telephone at the time of interpretation on 12/11/2019 at 7:58 pm to provider [facility] , who verbally acknowledged these results. Electronically Signed   By: Randa Ngo M.D.   On: 12/11/2019 19:58   DG Chest Portable 1 View  Result Date: 12/11/2019 CLINICAL DATA:  Shortness of breath.  COVID diagnosed on 11/28/2019. EXAM: PORTABLE CHEST 1 VIEW COMPARISON:  Chest x-ray dated 03/24/2018 FINDINGS: The heart size and mediastinal contours are within normal limits. Both lungs are clear. The visualized skeletal structures are unremarkable. IMPRESSION: Normal exam. Electronically Signed   By: Lorriane Shire M.D.   On: 12/11/2019 17:00   VAS [place] LOWER EXTREMITY VENOUS (DVT) (ONLY [facility] & [facility])  Result Date: 12/11/2019  Lower Venous DVTStudy Indications: Swelling.  Risk Factors: COVID 19 positive. Comparison Study: No prior studies. Performing Technologist: Oliver Hum RVT  Examination Guidelines: A complete evaluation includes B-mode imaging, spectral Doppler, color Doppler, and power Doppler as needed of all accessible portions of each vessel. Bilateral testing is considered an integral part of a complete examination. Limited examinations  for reoccurring indications may be performed as noted. The reflux portion of the exam is performed with the patient in reverse Trendelenburg.  +---------+---------------+---------+-----------+----------+--------------+ RIGHT    CompressibilityPhasicitySpontaneityPropertiesThrombus Aging +---------+---------------+---------+-----------+----------+--------------+ CFV      Full           Yes      Yes                                 +---------+---------------+---------+-----------+----------+--------------+  SFJ      Full                                                        +---------+---------------+---------+-----------+----------+--------------+ FV Prox  Full                                                        +---------+---------------+---------+-----------+----------+--------------+ FV Mid   Full                                                        +---------+---------------+---------+-----------+----------+--------------+ FV DistalFull                                                        +---------+---------------+---------+-----------+----------+--------------+ PFV      Full                                                        +---------+---------------+---------+-----------+----------+--------------+ POP      Full           Yes      Yes                                 +---------+---------------+---------+-----------+----------+--------------+ PTV      Full                                                        +---------+---------------+---------+-----------+----------+--------------+ PERO     Full                                                        +---------+---------------+---------+-----------+----------+--------------+   +---------+---------------+---------+-----------+----------+--------------+ LEFT     CompressibilityPhasicitySpontaneityPropertiesThrombus Aging  +---------+---------------+---------+-----------+----------+--------------+ CFV      Full           Yes      Yes                                 +---------+---------------+---------+-----------+----------+--------------+ SFJ      Full                                                        +---------+---------------+---------+-----------+----------+--------------+  FV Prox  Full                                                        +---------+---------------+---------+-----------+----------+--------------+ FV Mid   Full                                                        +---------+---------------+---------+-----------+----------+--------------+ FV DistalPartial        Yes      Yes                  Acute          +---------+---------------+---------+-----------+----------+--------------+ PFV      Full                                                        +---------+---------------+---------+-----------+----------+--------------+ POP      Full           Yes      Yes                                 +---------+---------------+---------+-----------+----------+--------------+ PTV      Full                                                        +---------+---------------+---------+-----------+----------+--------------+ PERO     Full                                                        +---------+---------------+---------+-----------+----------+--------------+     Summary: RIGHT: - There is no evidence of deep vein thrombosis in the lower extremity.  - No cystic structure found in the popliteal fossa.  LEFT: - Findings consistent with acute deep vein thrombosis involving the left femoral vein. - No cystic structure found in the popliteal fossa.  *See table(s) above for measurements and observations.    Preliminary     Pending Labs Unresulted Labs (From admission, onward)    Start     Ordered   12/12/19 0500  CBC  Daily,   R     12/11/19 2109    12/12/19 0100  Heparin level (unfractionated)  Once-Timed,   STAT     12/11/19 2044   12/12/19 0022  Respiratory Panel by RT PCR (Flu A&B, Covid) - Nasopharyngeal Swab  (Tier 2 Respiratory Panel by RT PCR (Flu A&B, Covid) (TAT 2 hrs))  Once,   STAT    Question Answer Comment  Is this test for diagnosis or screening Screening   Symptomatic for COVID-19 as defined by CDC No   Hospitalized for COVID-19 No  Admitted to ICU for COVID-19 No   Previously tested for COVID-19 No   Resident in a congregate (group) care setting No   Employed in healthcare setting No      12/12/19 0021   Signed and Held  HIV Antibody (routine testing w rflx)  (HIV Antibody (Routine testing w reflex) panel)  Once,   R     Signed and Held   Signed and Held  Magnesium  Once,   R     Signed and Held   Signed and Held  Phosphorus  Once,   R     Signed and Held   Signed and Armed forces training and education officer morning,   R     Signed and Held   Signed and Held  CBC  Tomorrow morning,   R     Signed and Held   Signed and Held  Sodium, urine, random  Once,   R     Signed and Held   Signed and Held  Protein / creatinine ratio, urine  Once,   R     Signed and Held          Vitals/Pain Today's Vitals   12/11/19 2320 12/11/19 2320 12/11/19 2330 12/12/19 0030  BP:   111/86 110/88  Pulse: (!) 125  (!) 125 (!) 118  Resp: (!) 25  (!) 27 (!) 24  Temp:    98.3 F (36.8 C)  SpO2: 95%  94% 94%  Weight:      Height:      PainSc:  2       Isolation Precautions No active isolations  Medications Medications  heparin bolus via infusion 4,000 Units (4,000 Units Intravenous Bolus from Bag 12/11/19 1853)    Followed by  heparin ADULT infusion 100 units/mL (25000 units/240mL sodium chloride 0.45%) (1,700 Units/hr Intravenous New Bag/Given 12/11/19 1856)  sodium chloride 0.9 % bolus 1,000 mL (0 mLs Intravenous Stopped 12/11/19 1930)  acetaminophen (TYLENOL) tablet 650 mg (650 mg Oral Given 12/11/19 1653)  iohexol  (OMNIPAQUE) 350 MG/ML injection 100 mL (100 mLs Intravenous Contrast Given 12/11/19 1948)  acetaminophen (TYLENOL) tablet 650 mg (650 mg Oral Given 12/11/19 2158)  ibuprofen (ADVIL) tablet 800 mg (800 mg Oral Given 12/11/19 2239)    Mobility walks

## 2019-12-13 ENCOUNTER — Encounter (HOSPITAL_COMMUNITY): Payer: Self-pay | Admitting: *Deleted

## 2019-12-13 LAB — BASIC METABOLIC PANEL
Anion gap: 11 (ref 5–15)
BUN: 14 mg/dL (ref 6–20)
CO2: 20 mmol/L — ABNORMAL LOW (ref 22–32)
Calcium: 8.3 mg/dL — ABNORMAL LOW (ref 8.9–10.3)
Chloride: 112 mmol/L — ABNORMAL HIGH (ref 98–111)
Creatinine, Ser: 1.09 mg/dL (ref 0.61–1.24)
GFR calc Af Amer: >60 mL/min (ref >60)
GFR calc non Af Amer: >60 mL/min (ref >60)
Glucose, Bld: 126 mg/dL — ABNORMAL HIGH (ref 70–99)
Potassium: 3.7 mmol/L (ref 3.5–5.1)
Sodium: 143 mmol/L (ref 135–145)

## 2019-12-13 LAB — HEPARIN LEVEL (UNFRACTIONATED)
Heparin Unfractionated: 0.16 IU/mL — ABNORMAL LOW (ref 0.30–0.70)
Heparin Unfractionated: 0.59 IU/mL (ref 0.30–0.70)
Heparin Unfractionated: 0.55 IU/mL (ref 0.30–0.70)

## 2019-12-13 LAB — CBC
HCT: 46.8 % (ref 39.0–52.0)
Hemoglobin: 15.4 g/dL (ref 13.0–17.0)
MCH: 28.5 pg (ref 26.0–34.0)
MCHC: 32.9 g/dL (ref 30.0–36.0)
MCV: 86.5 fL (ref 80.0–100.0)
Platelets: 155 10*3/uL (ref 150–400)
RBC: 5.41 MIL/uL (ref 4.22–5.81)
RDW: 13.9 % (ref 11.5–15.5)
WBC: 7 10*3/uL (ref 4.0–10.5)
nRBC: 0 % (ref 0.0–0.2)

## 2019-12-13 LAB — D-DIMER, QUANTITATIVE: D-Dimer, Quant: 5.04 ug/mL-FEU — ABNORMAL HIGH (ref 0.00–0.50)

## 2019-12-13 LAB — C-REACTIVE PROTEIN: CRP: 13.2 mg/dL — ABNORMAL HIGH (ref <1.0)

## 2019-12-13 MED ORDER — DEXAMETHASONE SODIUM PHOSPHATE 10 MG/ML IJ SOLN
6.0000 mg | Freq: Every day | INTRAMUSCULAR | Status: DC
  Administered 2019-12-13 – 2019-12-17 (×5): 6 mg via INTRAVENOUS
  Filled 2019-12-13 (×5): qty 1

## 2019-12-13 MED ORDER — ALBUTEROL SULFATE HFA 108 (90 BASE) MCG/ACT IN AERS: 2.0000 | INHALATION_SPRAY | RESPIRATORY_TRACT | Status: DC | PRN

## 2019-12-13 MED ORDER — HYDRALAZINE HCL 10 MG PO TABS
5.0000 mg | ORAL_TABLET | Freq: Three times a day (TID) | ORAL | Status: DC | PRN
  Administered 2019-12-15: 5 mg via ORAL
  Filled 2019-12-13: qty 1

## 2019-12-13 MED ORDER — DM-GUAIFENESIN ER 30-600 MG PO TB12
1.0000 | ORAL_TABLET | Freq: Two times a day (BID) | ORAL | Status: DC
  Administered 2019-12-13 – 2019-12-17 (×9): 1 via ORAL
  Filled 2019-12-13 (×9): qty 1

## 2019-12-13 MED ORDER — PROMETHAZINE HCL 25 MG/ML IJ SOLN: 6.2500 mg | Freq: Four times a day (QID) | INTRAMUSCULAR | Status: DC | PRN

## 2019-12-13 MED ORDER — ALBUTEROL SULFATE HFA 108 (90 BASE) MCG/ACT IN AERS
1.0000 | INHALATION_SPRAY | Freq: Four times a day (QID) | RESPIRATORY_TRACT | Status: DC
  Administered 2019-12-13 – 2019-12-17 (×14): 1 via RESPIRATORY_TRACT

## 2019-12-13 MED ORDER — HEPARIN BOLUS VIA INFUSION
2500.0000 [IU] | Freq: Once | INTRAVENOUS | Status: AC
  Administered 2019-12-13: 2500 [IU] via INTRAVENOUS
  Filled 2019-12-13: qty 2500

## 2019-12-13 MED ORDER — PANTOPRAZOLE SODIUM 40 MG IV SOLR
40.0000 mg | Freq: Two times a day (BID) | INTRAVENOUS | Status: DC
  Administered 2019-12-13 (×2): 40 mg via INTRAVENOUS
  Filled 2019-12-13 (×2): qty 40

## 2019-12-13 NOTE — Progress Notes (Addendum)
Patient's BP elevated. Dr. Sunnie Nielsen notified new orders received

## 2019-12-13 NOTE — Progress Notes (Signed)
ANTICOAGULATION CONSULT NOTE - Follow Up Consult  Pharmacy Consult for Heparin Indication: pulmonary embolus and DVT  No Known Allergies  Patient Measurements: Height: 6' (182.9 cm) Weight: 236 lb 5.3 oz (107.2 kg) IBW/kg (Calculated) : 77.6 HEPARIN DW (KG): 100.1   Vital Signs: Temp: 100.5 F (38.1 C) (02/27 0400) Temp Source: Axillary (02/27 0400) BP: 139/91 (02/27 0504) Pulse Rate: 109 (02/27 0504)  Labs: Recent Labs    12/11/19 1656 12/11/19 1656 12/11/19 1855 12/12/19 0155 12/12/19 0949 12/12/19 1132 12/13/19 0220  HGB 18.0*   < >  --  16.5  --   --  15.4  HCT 53.8*  --   --  50.0  --   --  46.8  PLT 149*  --   --  171  --   --  155  APTT 29  --   --   --   --   --   --   LABPROT 13.1  --   --   --   --   --   --   INR 1.0  --   --   --   --   --   --   HEPARINUNFRC  --   --   --  0.34 0.40  --  0.16*  CREATININE 1.36*  --   --  1.30*  --   --   --   TROPONINIHS 197*   < > 302*  --  113* 128*  --    < > = values in this interval not displayed.    Estimated Creatinine Clearance: 76.4 mL/min (A) (by C-G formula based on SCr of 1.3 mg/dL (H)).   Medications:  Infusions:  . sodium chloride 100 mL/hr at 12/13/19 0436  . heparin 1,700 Units/hr (12/13/19 0436)  . remdesivir 100 mg in NS 100 mL     No anticoag PTA  Assessment: 58 y.o.male with chief complaint shortness of breath and leg swelling. COVID infection diagnosed 2 weeks ago. Venous duplex consistent with LLE DVT and CT showed bilateral acute PE with evidence of right heart strain.  PMH significant for saddle PE in 2019.  He was on Eliquis x2 years but stopped anticoagulation in Dec 2020.  Baseline labs: Hgb 18 and plts 149. INR, aPTT at baseline.  Pharmacy has been consulted for IV heparin dosing.   12/13/2019:  Heparin level now sub-therapeutic (0.16) on IV heparin at 1700 units/hr  CBC: WNL  No bleeding or infusion related issues reported by RN  Goal of Therapy:  Heparin level 0.3-0.7  units/ml Monitor platelets by anticoagulation protocol: Yes   Plan:  Re-bolus heparin 2500 units IV x1  Increase heparin infusion to 1900 units/hr Recheck heparin level 6h after rate change Daily heparin level & CBC while on heparin F/U plans for long-term anticoagulation  Junita Push PharmD, BCPS 12/13/2019,7:37 AM

## 2019-12-13 NOTE — Progress Notes (Addendum)
PROGRESS NOTE    Duane Gregory  XJO:[id number] DOB: 1961/04/11 DOA: 12/11/2019 PCP: Richmond Campbell., PA-C   Brief Narrative: 59 year old with past medical history significant for nephrolithiasis, pulmonary embolism diagnosed 2 years ago and was on anticoagulation for 2 years.  He completed his treatment on December 2020.  2 weeks ago patient was diagnosed with Covid infection that was managed supportively at home.  Over the course of 2 weeks he has developed progressive shortness of breath leading to his current presentation to the hospital.  CT chest done showed submassive bilateral PE with right heart strain.  Doppler lower extremity PVR acute left femoral DVT.   Assessment & Plan:   Active Problems:   Acute pulmonary embolism (HCC)   1-Submassive PE; right side heart strain; Echocardiogram shows severely reduced right ventricular function.  CCM consulted. No indication for thrombolysis at this time.  Troponin trending down. Continue with heparin drip. For at least 72 hours.  Vital stable.  Oxygen sat 94 on room air Discussed with Dr Sherene Sires, we can use NOAC, for treatment.   2-Viral COVID-19 infection: First few days of diagnosis patient was having subjective fever, cough, muscle pain. He continues to have mild cough.  Yesterday he developed worsening shortness of breath. -He was diagnosed with Covid on February 10. Day 16 of infection.  -CRP is elevated 16--13, D-dimer has decreased to 5.7.---5.0 -Continue with Remdesivir day 2.   -Started IV decadron Oxygen sat was at 91--94 RA  3-Metabolic Acidosis; he report diarrhea.  Continue with IV fluids. Improved, bicarb at 20.   4-Left femoral DVT; on heparin Gtt.  5-AKI; continue with IV fluids. Improved.   6-Diarrhea; likely related to Covid.  Support care,.   Estimated body mass index is 32.05 kg/m as calculated from the following:   Height as of this encounter: 6' (1.829 m).   Weight as of this encounter: 107.2  kg.   DVT prophylaxis: Heparin Gtt Code Status: Full code Family Communication: Care discussed with patient. Will update daughter later today  Disposition Plan:  Patient is from: Home  Anticipated d/c date: in  5 days  Barriers to d/c or necessity for inpatient status: submassive PE, requiring Heparin Gtt  Consultants:   CCM  Procedures:   ECHO  Antimicrobials:    Subjective: Denies worsening dyspnea, still with cough spell.  Still having Diarrhea.  Report nausea.   Objective: Vitals:   12/13/19 0800 12/13/19 0900 12/13/19 1100 12/13/19 1137  BP: 131/74 (!) 148/110 (!) 151/109   Pulse: (!) 102 (!) 106 97   Resp: 17 (!) 28 (!) 22   Temp: (!) 97.4 F (36.3 C)   (!) 97.5 F (36.4 C)  TempSrc: Oral   Oral  SpO2: 95% 94% 95%   Weight:      Height:        Intake/Output Summary (Last 24 hours) at 12/13/2019 1342 Last data filed at 12/13/2019 0436 Gross per 24 hour  Intake 2020.66 ml  Output 2 ml  Net 2018.66 ml   Filed Weights   12/11/19 1548 12/12/19 0155  Weight: 111.1 kg 107.2 kg    Examination:  General exam: NAD Respiratory system: Normal respiratory effort, crackles at the bases.  Cardiovascular system: S 1, S 2 RRR Gastrointestinal system: BS present, soft, nt Central nervous system: Alert and oriented Extremities: mild edema LE  Skin: No rashes    Data Reviewed: I have personally reviewed following labs and imaging studies  CBC: Recent Labs  Lab 12/11/19 1656  12/12/19 0155 12/13/19 0220  WBC 8.1 7.9 7.0  NEUTROABS 6.6  --   --   HGB 18.0* 16.5 15.4  HCT 53.8* 50.0 46.8  MCV 86.6 85.6 86.5  PLT 149* 171 155   Basic Metabolic Panel: Recent Labs  Lab 12/11/19 1656 12/12/19 0155 12/13/19 0908  NA 141 143 143  K 4.3 3.8 3.7  CL 105 110 112*  CO2 23 16* 20*  GLUCOSE 118* 130* 126*  BUN 15 16 14   CREATININE 1.36* 1.30* 1.09  CALCIUM 9.0 8.7* 8.3*  MG  --  2.2  --   PHOS  --  3.2  --    GFR: Estimated Creatinine Clearance: 91.1  mL/min (by C-G formula based on SCr of 1.09 mg/dL). Liver Function Tests: No results for input(s): AST, ALT, ALKPHOS, BILITOT, PROT, ALBUMIN in the last 168 hours. No results for input(s): LIPASE, AMYLASE in the last 168 hours. No results for input(s): AMMONIA in the last 168 hours. Coagulation Profile: Recent Labs  Lab 12/11/19 1656  INR 1.0   Cardiac Enzymes: No results for input(s): CKTOTAL, CKMB, CKMBINDEX, TROPONINI in the last 168 hours. BNP (last 3 results) No results for input(s): PROBNP in the last 8760 hours. HbA1C: No results for input(s): HGBA1C in the last 72 hours. CBG: No results for input(s): GLUCAP in the last 168 hours. Lipid Profile: No results for input(s): CHOL, HDL, LDLCALC, TRIG, CHOLHDL, LDLDIRECT in the last 72 hours. Thyroid Function Tests: No results for input(s): TSH, T4TOTAL, FREET4, T3FREE, THYROIDAB in the last 72 hours. Anemia Panel: No results for input(s): VITAMINB12, FOLATE, FERRITIN, TIBC, IRON, RETICCTPCT in the last 72 hours. Sepsis Labs: No results for input(s): PROCALCITON, LATICACIDVEN in the last 168 hours.  Recent Results (from the past 240 hour(s))  Respiratory Panel by RT PCR (Flu A&B, Covid) - Nasopharyngeal Swab     Status: Abnormal   Collection Time: 12/12/19 12:24 AM   Specimen: Nasopharyngeal Swab  Result Value Ref Range Status   SARS Coronavirus 2 by RT PCR POSITIVE (A) NEGATIVE Final    Comment: RESULT CALLED TO, READ BACK BY AND VERIFIED WITH: ALLIE FULLERTON @ 0245 ON 12/12/19 C VARNER (NOTE) SARS-CoV-2 target nucleic acids are DETECTED. SARS-CoV-2 RNA is generally detectable in upper respiratory specimens  during the acute phase of infection. Positive results are indicative of the presence of the identified virus, but do not rule out bacterial infection or co-infection with other pathogens not detected by the test. Clinical correlation with patient history and other diagnostic information is necessary to determine  patient infection status. The expected result is Negative. Fact Sheet for Patients:  12/14/19 Fact Sheet for Healthcare Providers: https://www.moore.com/ This test is not yet approved or cleared by the https://www.young.biz/ FDA and  has been authorized for detection and/or diagnosis of SARS-CoV-2 by FDA under an Emergency Use Authorization (EUA).  This EUA will remain in effect (meaning this test can be  used) for the duration of  the COVID-19 declaration under Section 564(b)(1) of the Act, 21 U.S.C. section 360bbb-3(b)(1), unless the authorization is terminated or revoked sooner.    Influenza A by PCR NEGATIVE NEGATIVE Final   Influenza B by PCR NEGATIVE NEGATIVE Final    Comment: (NOTE) The Xpert Xpress SARS-CoV-2/FLU/RSV assay is intended as an aid in  the diagnosis of influenza from Nasopharyngeal swab specimens and  should not be used as a sole basis for treatment. Nasal washings and  aspirates are unacceptable for Xpert Xpress SARS-CoV-2/FLU/RSV  testing. Fact  Sheet for Patients: PinkCheek.be Fact Sheet for Healthcare Providers: GravelBags.it This test is not yet approved or cleared by the [place] FDA and  has been authorized for detection and/or diagnosis of SARS-CoV-2 by  FDA under an Emergency Use Authorization (EUA). This EUA will remain  in effect (meaning this test can be used) for the duration of the  Covid-19 declaration under Section 564(b)(1) of the Act, 21  U.S.C. section 360bbb-3(b)(1), unless the authorization is  terminated or revoked. Performed at [facility], [place] [address] [place], [place] [address]          Radiology Studies: CT Angio Chest PE W and/or Wo Contrast  Result Date: 12/11/2019 CLINICAL DATA:  Short of breath, chest pain, history of pulmonary embolus EXAM: CT ANGIOGRAPHY CHEST WITH CONTRAST TECHNIQUE:  Multidetector CT imaging of the chest was performed using the standard protocol during bolus administration of intravenous contrast. Multiplanar CT image reconstructions and MIPs were obtained to evaluate the vascular anatomy. CONTRAST:  13mL OMNIPAQUE IOHEXOL 350 MG/ML SOLN COMPARISON:  07/10/2018 FINDINGS: Cardiovascular: This is a technically adequate evaluation of the pulmonary vasculature. Bilateral acute pulmonary emboli are seen, most pronounced within the right middle lobe, right lower lobe, and left lower lobe. Right ventricle is dilated consistent with right heart strain, RV/LV ratio measuring 1.9. Heart is not enlarged. No pericardial effusion. Thoracic aorta is normal in caliber. Mediastinum/Nodes: No enlarged mediastinal, hilar, or axillary lymph nodes. Thyroid gland, trachea, and esophagus demonstrate no significant findings. Lungs/Pleura: Dependent linear consolidation is seen within the left lower lobe, compatible with either hypoventilatory change or less likely developing infarct. No other consolidation, effusion, or pneumothorax. Central airways are patent. The sub solid nodule within the left upper lobe along the major fissure seen previously is no longer identified. No new pulmonary nodules. Upper Abdomen: There is diffuse fatty infiltration of the liver. Otherwise no acute abnormalities. Musculoskeletal: No acute or destructive bony lesions. Reconstructed images demonstrate no additional findings. Review of the MIP images confirms the above findings. IMPRESSION: 1. Bilateral acute pulmonary emboli, most pronounced within the right middle lobe, right lower lobe, and left lower lobe. Positive for acute PE with CTevidence of right heart strain (RV/LV Ratio = 1.9) consistent with at least submassive (intermediate risk) PE. The presence of right heart strain has been associated with an increased risk of morbidity and mortality. 2. Left lower lobe atelectasis versus developing infarct. 3. Previously  identified sub solid nodule within the left upper lobe is no longer identified. 4. Diffuse fatty infiltration of the liver. These results were called by telephone at the time of interpretation on 12/11/2019 at 7:58 pm to provider [facility] , who verbally acknowledged these results. Electronically Signed   By: Randa Ngo M.D.   On: 12/11/2019 19:58   DG Chest Portable 1 View  Result Date: 12/11/2019 CLINICAL DATA:  Shortness of breath.  COVID diagnosed on 11/28/2019. EXAM: PORTABLE CHEST 1 VIEW COMPARISON:  Chest x-ray dated 03/24/2018 FINDINGS: The heart size and mediastinal contours are within normal limits. Both lungs are clear. The visualized skeletal structures are unremarkable. IMPRESSION: Normal exam. Electronically Signed   By: Lorriane Shire M.D.   On: 12/11/2019 17:00   ECHOCARDIOGRAM COMPLETE  Result Date: 12/12/2019    ECHOCARDIOGRAM REPORT   Patient Name:   Duane Gregory Date of Exam: 12/12/2019 Medical Rec #:  [id number]      Height:       72.0 in Accession #:    [id number]  Weight:       236.3 lb Date of Birth:  01-16-1961     BSA:          2.287 m Patient Age:    58 years       BP:           108/81 mmHg Patient Gender: M              HR:           103 bpm. Exam Location:  Inpatient Procedure: 2D Echo, Cardiac Doppler and Color Doppler Indications:    I26.02 Pulmonary embolus  History:        Patient has prior history of Echocardiogram examinations, most                 recent 09/16/2018. Signs/Symptoms:Dyspnea. Covid 19 positive.  Sonographer:    Sheralyn Boatmanina West RDCS Referring Phys: [id number] SYLVESTER I OGBATA  Sonographer Comments: Technically difficult study due to poor echo windows, suboptimal apical window and suboptimal subcostal window. Image acquisition challenging due to respiratory motion. Suboptimal machine and EKG wires. IMPRESSIONS  1. Left ventricular ejection fraction, by estimation, is 60 to 65%. The left ventricle has normal function. The left ventricle has no regional wall  motion abnormalities. There is mild left ventricular hypertrophy. Left ventricular diastolic parameters were normal.  2. Right ventricular systolic function is severely reduced. The right ventricular size is severely enlarged. There is moderately elevated pulmonary artery systolic pressure.  3. Right atrial size was moderately dilated.  4. The mitral valve is normal in structure and function. No evidence of mitral valve regurgitation. No evidence of mitral stenosis.  5. The aortic valve is normal in structure and function. Aortic valve regurgitation is not visualized. No aortic stenosis is present.  6. Aortic dilatation noted. There is mild dilatation of the aortic root measuring 38 mm.  7. The inferior vena cava is normal in size with greater than 50% respiratory variability, suggesting right atrial pressure of 3 mmHg. FINDINGS  Left Ventricle: Left ventricular ejection fraction, by estimation, is 60 to 65%. The left ventricle has normal function. The left ventricle has no regional wall motion abnormalities. The left ventricular internal cavity size was normal in size. There is  mild left ventricular hypertrophy. Left ventricular diastolic parameters were normal. Right Ventricle: The right ventricular size is severely enlarged. No increase in right ventricular wall thickness. Right ventricular systolic function is severely reduced. There is moderately elevated pulmonary artery systolic pressure. The tricuspid regurgitant velocity is 3.67 m/s, and with an assumed right atrial pressure of 15 mmHg, the estimated right ventricular systolic pressure is 68.9 mmHg. Left Atrium: Left atrial size was normal in size. Right Atrium: Right atrial size was moderately dilated. Pericardium: There is no evidence of pericardial effusion. Mitral Valve: The mitral valve is normal in structure and function. Normal mobility of the mitral valve leaflets. No evidence of mitral valve regurgitation. No evidence of mitral valve stenosis.  Tricuspid Valve: The tricuspid valve is normal in structure. Tricuspid valve regurgitation is mild . No evidence of tricuspid stenosis. Aortic Valve: The aortic valve is normal in structure and function. Aortic valve regurgitation is not visualized. No aortic stenosis is present. Pulmonic Valve: The pulmonic valve was normal in structure. Pulmonic valve regurgitation is not visualized. No evidence of pulmonic stenosis. Aorta: The aortic root is normal in size and structure and aortic dilatation noted. There is mild dilatation of the aortic root measuring 38 mm. Venous: The inferior vena cava  is normal in size with greater than 50% respiratory variability, suggesting right atrial pressure of 3 mmHg. IAS/Shunts: No atrial level shunt detected by color flow Doppler.  LEFT VENTRICLE PLAX 2D LVIDd:         3.32 cm     Diastology LVIDs:         2.37 cm     LV e' lateral:   8.59 cm/s LV PW:         1.67 cm     LV E/e' lateral: 6.6 LV IVS:        1.41 cm     LV e' medial:    6.53 cm/s LVOT diam:     1.80 cm     LV E/e' medial:  8.7 LV SV:         38 LV SV Index:   17 LVOT Area:     2.54 cm  LV Volumes (MOD) LV vol d, MOD A2C: 58.8 ml LV vol d, MOD A4C: 44.8 ml LV vol s, MOD A2C: 28.7 ml LV vol s, MOD A4C: 23.8 ml LV SV MOD A2C:     30.1 ml LV SV MOD A4C:     44.8 ml LV SV MOD BP:      24.8 ml RIGHT VENTRICLE            IVC RV S prime:     5.44 cm/s  IVC diam: 1.89 cm TAPSE (M-mode): 1.0 cm LEFT ATRIUM           Index       RIGHT ATRIUM           Index LA diam:      2.80 cm 1.22 cm/m  RA Area:     19.40 cm LA Vol (A2C): 21.3 ml 9.31 ml/m  RA Volume:   64.00 ml  27.98 ml/m LA Vol (A4C): 35.8 ml 15.65 ml/m  AORTIC VALVE LVOT Vmax:   91.60 cm/s LVOT Vmean:  59.600 cm/s LVOT VTI:    0.151 m  AORTA Ao Root diam: 3.50 cm Ao Asc diam:  3.80 cm MITRAL VALVE               TRICUSPID VALVE MV Area (PHT): 6.12 cm    TR Peak grad:   53.9 mmHg MV Decel Time: 124 msec    TR Vmax:        367.00 cm/s MV E velocity: 56.80 cm/s MV A  velocity: 75.70 cm/s  SHUNTS MV E/A ratio:  0.75        Systemic VTI:  0.15 m                            Systemic Diam: 1.80 cm Charlton HawsPeter Nishan MD Electronically signed by Charlton HawsPeter Nishan MD Signature Date/Time: 12/12/2019/9:11:44 AM    Final    VAS US LOWER EXTREMITY VENOUS (DVT) (ONLY [facility] & [facility])  Result Date: 12/12/2019  Lower Venous DVTStudy Indications: Swelling.  Risk Factors: COVID 19 positive. Comparison Study: No prior studies. Performing Technologist: Chanda BusingGregory Collins RVT  Examination Guidelines: A complete evaluation includes B-mode imaging, spectral Doppler, color Doppler, and power Doppler as needed of all accessible portions of each vessel. Bilateral testing is considered an integral part of a complete examination. Limited examinations for reoccurring indications may be performed as noted. The reflux portion of the exam is performed with the patient in reverse Trendelenburg.  +---------+---------------+---------+-----------+----------+--------------+  RIGHT     Compressibility Phasicity Spontaneity  Properties Thrombus Aging  +---------+---------------+---------+-----------+----------+--------------+  CFV       Full            Yes       Yes                                    +---------+---------------+---------+-----------+----------+--------------+  SFJ       Full                                                             +---------+---------------+---------+-----------+----------+--------------+  FV Prox   Full                                                             +---------+---------------+---------+-----------+----------+--------------+  FV Mid    Full                                                             +---------+---------------+---------+-----------+----------+--------------+  FV Distal Full                                                             +---------+---------------+---------+-----------+----------+--------------+  PFV       Full                                                              +---------+---------------+---------+-----------+----------+--------------+  POP       Full            Yes       Yes                                    +---------+---------------+---------+-----------+----------+--------------+  PTV       Full                                                             +---------+---------------+---------+-----------+----------+--------------+  PERO      Full                                                             +---------+---------------+---------+-----------+----------+--------------+   +---------+---------------+---------+-----------+----------+--------------+  LEFT      Compressibility Phasicity Spontaneity Properties Thrombus Aging  +---------+---------------+---------+-----------+----------+--------------+  CFV       Full            Yes       Yes                                    +---------+---------------+---------+-----------+----------+--------------+  SFJ       Full                                                             +---------+---------------+---------+-----------+----------+--------------+  FV Prox   Full                                                             +---------+---------------+---------+-----------+----------+--------------+  FV Mid    Full                                                             +---------+---------------+---------+-----------+----------+--------------+  FV Distal Partial         Yes       Yes                    Acute           +---------+---------------+---------+-----------+----------+--------------+  PFV       Full                                                             +---------+---------------+---------+-----------+----------+--------------+  POP       Full            Yes       Yes                                    +---------+---------------+---------+-----------+----------+--------------+  PTV       Full                                                              +---------+---------------+---------+-----------+----------+--------------+  PERO      Full                                                             +---------+---------------+---------+-----------+----------+--------------+  Summary: RIGHT: - There is no evidence of deep vein thrombosis in the lower extremity.  - No cystic structure found in the popliteal fossa.  LEFT: - Findings consistent with acute deep vein thrombosis involving the left femoral vein. - No cystic structure found in the popliteal fossa.  *See table(s) above for measurements and observations. Electronically signed by Lemar Livings MD on 12/12/2019 at 11:57:01 AM.    Final         Scheduled Meds:  Chlorhexidine Gluconate Cloth  6 each Topical Daily   dexamethasone (DECADRON) injection  6 mg Intravenous Daily   dextromethorphan-guaiFENesin  1 tablet Oral BID   pantoprazole (PROTONIX) IV  40 mg Intravenous Q12H   Continuous Infusions:  sodium chloride 100 mL/hr at 12/13/19 0436   heparin 1,900 Units/hr (12/13/19 1006)   remdesivir 100 mg in NS 100 mL Stopped (12/13/19 1035)     LOS: 2 days    Time spent: 35 minutes    Duane Burnsworth A Jessel Gettinger, MD [facility]   If 7PM-7AM, please contact night-coverage [email or web address]  12/13/2019, 1:42 PM

## 2019-12-13 NOTE — Progress Notes (Signed)
ANTICOAGULATION CONSULT NOTE - Follow Up Consult  Pharmacy Consult for Heparin Indication: pulmonary embolus and DVT  No Known Allergies  Patient Measurements: Height: 6' (182.9 cm) Weight: 236 lb 5.3 oz (107.2 kg) IBW/kg (Calculated) : 77.6 HEPARIN DW (KG): 100.1   Vital Signs: Temp: 97.5 F (36.4 C) (02/27 1137) Temp Source: Oral (02/27 1137) BP: 133/100 (02/27 1600) Pulse Rate: 100 (02/27 1600)  Labs: Recent Labs    12/11/19 1656 12/11/19 1656 12/11/19 1855 12/12/19 0155 12/12/19 0155 12/12/19 0949 12/12/19 1132 12/13/19 0220 12/13/19 0908 12/13/19 1400  HGB 18.0*   < >  --  16.5  --   --   --  15.4  --   --   HCT 53.8*  --   --  50.0  --   --   --  46.8  --   --   PLT 149*  --   --  171  --   --   --  155  --   --   APTT 29  --   --   --   --   --   --   --   --   --   LABPROT 13.1  --   --   --   --   --   --   --   --   --   INR 1.0  --   --   --   --   --   --   --   --   --   HEPARINUNFRC  --   --   --  0.34   < > 0.40  --  0.16*  --  0.59  CREATININE 1.36*  --   --  1.30*  --   --   --   --  1.09  --   TROPONINIHS 197*   < > 302*  --   --  113* 128*  --   --   --    < > = values in this interval not displayed.    Estimated Creatinine Clearance: 91.1 mL/min (by C-G formula based on SCr of 1.09 mg/dL).   Medications:  Infusions:  . sodium chloride 100 mL/hr at 12/13/19 1422  . heparin 1,900 Units/hr (12/13/19 1006)  . remdesivir 100 mg in NS 100 mL Stopped (12/13/19 1035)   No anticoag PTA  Assessment: 59 y.o.male with chief complaint shortness of breath and leg swelling. COVID infection diagnosed 2 weeks ago. Venous duplex consistent with LLE DVT and CT showed bilateral acute PE with evidence of right heart strain.  PMH significant for saddle PE in 2019.  He was on Eliquis x2 years but stopped anticoagulation in Dec 2020.  Baseline labs: Hgb 18 and plts 149. INR, aPTT at baseline.  Pharmacy has been consulted for IV heparin dosing.    12/13/2019:  Heparin level now sub-therapeutic (0.16) on IV heparin at 1700 units/hr  CBC: WNL  No bleeding or infusion related issues reported by RN  PM heparin level 0.59 therapeutic  Goal of Therapy:  Heparin level 0.3-0.7 units/ml Monitor platelets by anticoagulation protocol: Yes   Plan:  Continue heparin infusion at 1900 units/hr Recheck heparin level 6h  Daily heparin level & CBC while on heparin F/U plans for long-term anticoagulation  Arley Phenix RPh 12/13/2019, 4:44 PM

## 2019-12-14 ENCOUNTER — Other Ambulatory Visit: Payer: Self-pay

## 2019-12-14 LAB — BASIC METABOLIC PANEL
Anion gap: 7 (ref 5–15)
BUN: 15 mg/dL (ref 6–20)
CO2: 21 mmol/L — ABNORMAL LOW (ref 22–32)
Calcium: 8.1 mg/dL — ABNORMAL LOW (ref 8.9–10.3)
Chloride: 114 mmol/L — ABNORMAL HIGH (ref 98–111)
Creatinine, Ser: 1.03 mg/dL (ref 0.61–1.24)
GFR calc Af Amer: >60 mL/min (ref >60)
GFR calc non Af Amer: >60 mL/min (ref >60)
Glucose, Bld: 125 mg/dL — ABNORMAL HIGH (ref 70–99)
Potassium: 3.8 mmol/L (ref 3.5–5.1)
Sodium: 142 mmol/L (ref 135–145)

## 2019-12-14 LAB — D-DIMER, QUANTITATIVE: D-Dimer, Quant: 5.15 ug/mL-FEU — ABNORMAL HIGH (ref 0.00–0.50)

## 2019-12-14 LAB — CBC
HCT: 42.1 % (ref 39.0–52.0)
Hemoglobin: 14.1 g/dL (ref 13.0–17.0)
MCH: 29 pg (ref 26.0–34.0)
MCHC: 33.5 g/dL (ref 30.0–36.0)
MCV: 86.6 fL (ref 80.0–100.0)
Platelets: 170 10*3/uL (ref 150–400)
RBC: 4.86 MIL/uL (ref 4.22–5.81)
RDW: 13.9 % (ref 11.5–15.5)
WBC: 6 10*3/uL (ref 4.0–10.5)
nRBC: 0 % (ref 0.0–0.2)

## 2019-12-14 LAB — HEPARIN LEVEL (UNFRACTIONATED)
Heparin Unfractionated: 0.69 IU/mL (ref 0.30–0.70)
Heparin Unfractionated: 0.57 IU/mL (ref 0.30–0.70)

## 2019-12-14 LAB — C-REACTIVE PROTEIN: CRP: 9.9 mg/dL — ABNORMAL HIGH (ref <1.0)

## 2019-12-14 MED ORDER — PANTOPRAZOLE SODIUM 40 MG PO TBEC
40.0000 mg | DELAYED_RELEASE_TABLET | Freq: Two times a day (BID) | ORAL | Status: DC
  Administered 2019-12-14 – 2019-12-17 (×7): 40 mg via ORAL
  Filled 2019-12-14 (×7): qty 1

## 2019-12-14 MED ORDER — ZINC SULFATE 220 (50 ZN) MG PO CAPS
220.0000 mg | ORAL_CAPSULE | Freq: Every day | ORAL | Status: DC
  Administered 2019-12-14 – 2019-12-17 (×4): 220 mg via ORAL
  Filled 2019-12-14 (×4): qty 1

## 2019-12-14 MED ORDER — VITAMIN D 25 MCG (1000 UNIT) PO TABS
1000.0000 [IU] | ORAL_TABLET | Freq: Every day | ORAL | Status: DC
  Administered 2019-12-14 – 2019-12-17 (×4): 1000 [IU] via ORAL
  Filled 2019-12-14 (×4): qty 1

## 2019-12-14 MED ORDER — ASCORBIC ACID 500 MG PO TABS
500.0000 mg | ORAL_TABLET | Freq: Two times a day (BID) | ORAL | Status: DC
  Administered 2019-12-14 – 2019-12-17 (×7): 500 mg via ORAL
  Filled 2019-12-14 (×7): qty 1

## 2019-12-14 NOTE — Progress Notes (Signed)
PHARMACIST - PHYSICIAN COMMUNICATION  DR:   Sunnie Nielsen  CONCERNING: IV to Oral Route Change Policy  RECOMMENDATION: This patient is receiving Protonix by the intravenous route.  Based on criteria approved by the Pharmacy and Therapeutics Committee, the intravenous medication(s) is/are being converted to the equivalent oral dose form(s).   DESCRIPTION: These criteria include:  The patient is eating (either orally or via tube) and/or has been taking other orally administered medications for a least 24 hours  The patient has no evidence of active gastrointestinal bleeding or impaired GI absorption (gastrectomy, short bowel, patient on TNA or NPO).  If you have questions about this conversion, please contact the Pharmacy Department  []   [phone] )  ( [phone] []   [phone] )  [facility] []   [phone] )  [facility] []   [phone] )  [facility] [x]   [phone] )  [facility]   [place], [facility] 12/14/2019 7:53 AM

## 2019-12-14 NOTE — Progress Notes (Signed)
PROGRESS NOTE    Duane Gregory  KYH:[id number] DOB: [birth year] DOA: 12/11/2019 PCP: Richmond Campbell., PA-C   Brief Narrative: 59 year old with past medical history significant for nephrolithiasis, pulmonary embolism diagnosed 2 years ago and was on anticoagulation for 2 years.  He completed his treatment on December 2020.  2 weeks ago patient was diagnosed with Covid infection that was managed supportively at home.  Over the course of 2 weeks he has developed progressive shortness of breath leading to his current presentation to the hospital.  CT chest done showed submassive bilateral PE with right heart strain.  Doppler lower extremity PVR acute left femoral DVT.   Assessment & Plan:   Active Problems:   Acute pulmonary embolism (HCC)   1-Submassive PE; right side heart strain; Echocardiogram shows severely reduced right ventricular function.  CCM consulted. No indication for thrombolysis at this time.  Troponin trending down. Continue with heparin drip. For at least 72 hours.  Vital stable.  Oxygen sat 94 on room air Discussed with Dr Sherene Sires, we can use NOAC, for treatment.  Vitals continue to be stable. Continue with heparin.   2-Viral COVID-19 infection: First few days of diagnosis patient was having subjective fever, cough, muscle pain. He continues to have mild cough.  Yesterday he developed worsening shortness of breath. -He was diagnosed with Covid on February 10. Day 16 of infection.  -CRP is elevated 16--13--9, D-dimer has decreased to 5.7.---5.0 -Continue with Remdesivir day 3.   -Started IV decadron Oxygen sat was at 91--94 RA  3-Metabolic Acidosis; he report diarrhea.  Continue with IV fluids. Improved, bicarb at 20.  Improved.   4-Left femoral DVT; on heparin Gtt.  5-AKI; continue with IV fluids. Improved.   6-Diarrhea; likely related to Covid.  Support care,.  Improved.   Estimated body mass index is 32.05 kg/m as calculated from the following:   Height  as of this encounter: 6' (1.829 m).   Weight as of this encounter: 107.2 kg.   DVT prophylaxis: Heparin Gtt Code Status: Full code Family Communication: Care discussed with patient. Will update daughter later today  Disposition Plan:  Patient is from: Home  Anticipated d/c date: in  5 days  Barriers to d/c or necessity for inpatient status: submassive PE, requiring Heparin Gtt  Consultants:   CCM  Procedures:   ECHO  Antimicrobials:    Subjective: He report less episodes of cough.  He is breathing better.  Diarrhea improved.    Objective: Vitals:   12/14/19 0851 12/14/19 1000 12/14/19 1200 12/14/19 1230  BP:  (!) 136/94 (!) 132/95   Pulse:  98 87   Resp:  (!) 22 18   Temp: 97.8 F (36.6 C)   (!) 97.1 F (36.2 C)  TempSrc: Oral   Oral  SpO2:  95% 94%   Weight:      Height:        Intake/Output Summary (Last 24 hours) at 12/14/2019 1253 Last data filed at 12/14/2019 0941 Gross per 24 hour  Intake 1706.13 ml  Output 1300 ml  Net 406.13 ml   Filed Weights   12/11/19 1548 12/12/19 0155  Weight: 111.1 kg 107.2 kg    Examination:  General exam: NAD Respiratory system:CTA Cardiovascular system: S 1, S 2 RRR Gastrointestinal system: BS present, soft, nt Central nervous system: Alert and oriented.  Extremities:edema  Skin: No rashes   Data Reviewed: I have personally reviewed following labs and imaging studies  CBC: Recent Labs  Lab 12/11/19 1656 12/12/19  0155 12/13/19 0220 12/14/19 0336  WBC 8.1 7.9 7.0 6.0  NEUTROABS 6.6  --   --   --   HGB 18.0* 16.5 15.4 14.1  HCT 53.8* 50.0 46.8 42.1  MCV 86.6 85.6 86.5 86.6  PLT 149* 171 155 160   Basic Metabolic Panel: Recent Labs  Lab 12/11/19 1656 12/12/19 0155 12/13/19 0908 12/14/19 0336  NA 141 143 143 142  K 4.3 3.8 3.7 3.8  CL 105 110 112* 114*  CO2 23 16* 20* 21*  GLUCOSE 118* 130* 126* 125*  BUN 15 16 14 15   CREATININE 1.36* 1.30* 1.09 1.03  CALCIUM 9.0 8.7* 8.3* 8.1*  MG  --  2.2   --   --   PHOS  --  3.2  --   --    GFR: Estimated Creatinine Clearance: 96.4 mL/min (by C-G formula based on SCr of 1.03 mg/dL). Liver Function Tests: No results for input(s): AST, ALT, ALKPHOS, BILITOT, PROT, ALBUMIN in the last 168 hours. No results for input(s): LIPASE, AMYLASE in the last 168 hours. No results for input(s): AMMONIA in the last 168 hours. Coagulation Profile: Recent Labs  Lab 12/11/19 1656  INR 1.0   Cardiac Enzymes: No results for input(s): CKTOTAL, CKMB, CKMBINDEX, TROPONINI in the last 168 hours. BNP (last 3 results) No results for input(s): PROBNP in the last 8760 hours. HbA1C: No results for input(s): HGBA1C in the last 72 hours. CBG: No results for input(s): GLUCAP in the last 168 hours. Lipid Profile: No results for input(s): CHOL, HDL, LDLCALC, TRIG, CHOLHDL, LDLDIRECT in the last 72 hours. Thyroid Function Tests: No results for input(s): TSH, T4TOTAL, FREET4, T3FREE, THYROIDAB in the last 72 hours. Anemia Panel: No results for input(s): VITAMINB12, FOLATE, FERRITIN, TIBC, IRON, RETICCTPCT in the last 72 hours. Sepsis Labs: No results for input(s): PROCALCITON, LATICACIDVEN in the last 168 hours.  Recent Results (from the past 240 hour(s))  Respiratory Panel by RT PCR (Flu A&B, Covid) - Nasopharyngeal Swab     Status: Abnormal   Collection Time: 12/12/19 12:24 AM   Specimen: Nasopharyngeal Swab  Result Value Ref Range Status   SARS Coronavirus 2 by RT PCR POSITIVE (A) NEGATIVE Final    Comment: RESULT CALLED TO, READ BACK BY AND VERIFIED WITH: ALLIE FULLERTON @ 1093 ON 12/12/19 C VARNER (NOTE) SARS-CoV-2 target nucleic acids are DETECTED. SARS-CoV-2 RNA is generally detectable in upper respiratory specimens  during the acute phase of infection. Positive results are indicative of the presence of the identified virus, but do not rule out bacterial infection or co-infection with other pathogens not detected by the test. Clinical correlation with  patient history and other diagnostic information is necessary to determine patient infection status. The expected result is Negative. Fact Sheet for Patients:  PinkCheek.be Fact Sheet for Healthcare Providers: GravelBags.it This test is not yet approved or cleared by the [place] FDA and  has been authorized for detection and/or diagnosis of SARS-CoV-2 by FDA under an Emergency Use Authorization (EUA).  This EUA will remain in effect (meaning this test can be  used) for the duration of  the COVID-19 declaration under Section 564(b)(1) of the Act, 21 U.S.C. section 360bbb-3(b)(1), unless the authorization is terminated or revoked sooner.    Influenza A by PCR NEGATIVE NEGATIVE Final   Influenza B by PCR NEGATIVE NEGATIVE Final    Comment: (NOTE) The Xpert Xpress SARS-CoV-2/FLU/RSV assay is intended as an aid in  the diagnosis of influenza from Nasopharyngeal swab specimens and  should not be used as a sole basis for treatment. Nasal washings and  aspirates are unacceptable for Xpert Xpress SARS-CoV-2/FLU/RSV  testing. Fact Sheet for Patients: https://www.moore.com/ Fact Sheet for Healthcare Providers: https://www.young.biz/ This test is not yet approved or cleared by the [place] FDA and  has been authorized for detection and/or diagnosis of SARS-CoV-2 by  FDA under an Emergency Use Authorization (EUA). This EUA will remain  in effect (meaning this test can be used) for the duration of the  Covid-19 declaration under Section 564(b)(1) of the Act, 21  U.S.C. section 360bbb-3(b)(1), unless the authorization is  terminated or revoked. Performed at [facility], [address], [place], [place] [address]          Radiology Studies: No results found.      Scheduled Meds: . albuterol  1 puff Inhalation Q6H  . vitamin C  500 mg Oral BID  .  Chlorhexidine Gluconate Cloth  6 each Topical Daily  . cholecalciferol  1,000 Units Oral Daily  . dexamethasone (DECADRON) injection  6 mg Intravenous Daily  . dextromethorphan-guaiFENesin  1 tablet Oral BID  . pantoprazole  40 mg Oral BID  . zinc sulfate  220 mg Oral Daily   Continuous Infusions: . sodium chloride 100 mL/hr at 12/14/19 1136  . heparin 1,800 Units/hr (12/14/19 1222)  . remdesivir 100 mg in NS 100 mL Stopped (12/14/19 0945)     LOS: 3 days    Time spent: 35 minutes    Tomicka Lover A Nikky Duba, MD [facility]   If 7PM-7AM, please contact night-coverage [email or web address]  12/14/2019, 12:53 PM

## 2019-12-14 NOTE — Progress Notes (Signed)
ANTICOAGULATION CONSULT NOTE - Follow Up Consult  Pharmacy Consult for Heparin Indication:   No Known Allergies  Patient Measurements: Height: 6' (182.9 cm) Weight: 236 lb 5.3 oz (107.2 kg) IBW/kg (Calculated) : 77.6 Heparin Dosing Weight:   Vital Signs: Temp: 98 F (36.7 C) (02/28 0400) Temp Source: Oral (02/28 0400) BP: 134/92 (02/28 0500) Pulse Rate: 72 (02/28 0500)  Labs: Recent Labs     0000 12/11/19 1656 12/11/19 1656 12/11/19 1855 12/12/19 0155 12/12/19 0155 12/12/19 0949 12/12/19 0949 12/12/19 1132 12/13/19 0220 12/13/19 0220 12/13/19 0908 12/13/19 1400 12/13/19 2232 12/14/19 0336  HGB   < > 18.0*   < >  --  16.5  --   --   --   --  15.4  --   --   --   --  14.1  HCT  --  53.8*   < >  --  50.0  --   --   --   --  46.8  --   --   --   --  42.1  PLT  --  149*   < >  --  171  --   --   --   --  155  --   --   --   --  170  APTT  --  29  --   --   --   --   --   --   --   --   --   --   --   --   --   LABPROT  --  13.1  --   --   --   --   --   --   --   --   --   --   --   --   --   INR  --  1.0  --   --   --   --   --   --   --   --   --   --   --   --   --   HEPARINUNFRC  --   --   --   --  0.34   < > 0.40   < >  --  0.16*   < >  --  0.59 0.55 0.69  CREATININE  --  1.36*   < >  --  1.30*  --   --   --   --   --   --  1.09  --   --  1.03  TROPONINIHS  --  197*   < > 302*  --   --  113*  --  128*  --   --   --   --   --   --    < > = values in this interval not displayed.    Estimated Creatinine Clearance: 96.4 mL/min (by C-G formula based on SCr of 1.03 mg/dL).   Medications:  Infusions:  . sodium chloride 100 mL/hr at 12/13/19 1800  . heparin 1,900 Units/hr (12/13/19 1800)  . remdesivir 100 mg in NS 100 mL Stopped (12/13/19 1035)    Assessment: Patient with heparin level at goal.  No heparin issues noted.  Next heparin level still within goal but upper end of goal.  Goal of Therapy:  Heparin level 0.3-0.7 units/ml Monitor platelets by  anticoagulation protocol: Yes   Plan:  Decrease heparin to 1800 units/hr Recheck level at [address], [place] 12/14/2019,5:56 AM

## 2019-12-14 NOTE — Progress Notes (Signed)
ANTICOAGULATION CONSULT NOTE - Follow Up Consult  Pharmacy Consult for Heparin Indication:   No Known Allergies  Patient Measurements: Height: 6' (182.9 cm) Weight: 236 lb 5.3 oz (107.2 kg) IBW/kg (Calculated) : 77.6 Heparin Dosing Weight:   Vital Signs: Temp: 98.1 F (36.7 C) (02/28 1544) Temp Source: Oral (02/28 1544) BP: 138/97 (02/28 1400) Pulse Rate: 92 (02/28 1400)  Labs: Recent Labs     0000 12/11/19 1656 12/11/19 1656 12/11/19 1855 12/12/19 0155 12/12/19 0155 12/12/19 0949 12/12/19 0949 12/12/19 1132 12/13/19 0220 12/13/19 0908 12/13/19 1400 12/13/19 2232 12/14/19 0336 12/14/19 1511  HGB   < > 18.0*   < >  --  16.5  --   --   --   --  15.4  --   --   --  14.1  --   HCT  --  53.8*   < >  --  50.0  --   --   --   --  46.8  --   --   --  42.1  --   PLT  --  149*   < >  --  171  --   --   --   --  155  --   --   --  170  --   APTT  --  29  --   --   --   --   --   --   --   --   --   --   --   --   --   LABPROT  --  13.1  --   --   --   --   --   --   --   --   --   --   --   --   --   INR  --  1.0  --   --   --   --   --   --   --   --   --   --   --   --   --   HEPARINUNFRC  --   --   --   --  0.34   < > 0.40   < >  --  0.16*  --    < > 0.55 0.69 0.57  CREATININE  --  1.36*   < >  --  1.30*  --   --   --   --   --  1.09  --   --  1.03  --   TROPONINIHS  --  197*   < > 302*  --   --  113*  --  128*  --   --   --   --   --   --    < > = values in this interval not displayed.    Estimated Creatinine Clearance: 96.4 mL/min (by C-G formula based on SCr of 1.03 mg/dL).   Medications:  Infusions:  . sodium chloride 50 mL/hr at 12/14/19 1322  . heparin 1,800 Units/hr (12/14/19 1222)  . remdesivir 100 mg in NS 100 mL Stopped (12/14/19 0945)    Assessment: Patient with heparin level at goal.  No heparin issues noted.    Goal of Therapy:  Heparin level 0.3-0.7 units/ml Monitor platelets by anticoagulation protocol: Yes   Plan:  continue heparin at 1800  units/hr Daily HL and CBC  Arley Phenix RPh 12/14/2019, 3:49 PM

## 2019-12-15 ENCOUNTER — Encounter (HOSPITAL_COMMUNITY): Payer: Self-pay | Admitting: Internal Medicine

## 2019-12-15 LAB — CBC
HCT: 42.3 % (ref 39.0–52.0)
Hemoglobin: 14.1 g/dL (ref 13.0–17.0)
MCH: 29 pg (ref 26.0–34.0)
MCHC: 33.3 g/dL (ref 30.0–36.0)
MCV: 86.9 fL (ref 80.0–100.0)
Platelets: 182 10*3/uL (ref 150–400)
RBC: 4.87 MIL/uL (ref 4.22–5.81)
RDW: 13.9 % (ref 11.5–15.5)
WBC: 6.3 10*3/uL (ref 4.0–10.5)
nRBC: 0 % (ref 0.0–0.2)

## 2019-12-15 LAB — BASIC METABOLIC PANEL
Anion gap: 10 (ref 5–15)
BUN: 17 mg/dL (ref 6–20)
CO2: 23 mmol/L (ref 22–32)
Calcium: 8.3 mg/dL — ABNORMAL LOW (ref 8.9–10.3)
Chloride: 110 mmol/L (ref 98–111)
Creatinine, Ser: 0.95 mg/dL (ref 0.61–1.24)
GFR calc Af Amer: >60 mL/min (ref >60)
GFR calc non Af Amer: >60 mL/min (ref >60)
Glucose, Bld: 123 mg/dL — ABNORMAL HIGH (ref 70–99)
Potassium: 3.5 mmol/L (ref 3.5–5.1)
Sodium: 143 mmol/L (ref 135–145)

## 2019-12-15 LAB — D-DIMER, QUANTITATIVE: D-Dimer, Quant: 5.3 ug/mL-FEU — ABNORMAL HIGH (ref 0.00–0.50)

## 2019-12-15 LAB — HEPARIN LEVEL (UNFRACTIONATED): Heparin Unfractionated: 0.51 IU/mL (ref 0.30–0.70)

## 2019-12-15 LAB — C-REACTIVE PROTEIN: CRP: 5.7 mg/dL — ABNORMAL HIGH (ref <1.0)

## 2019-12-15 LAB — VITAMIN D 25 HYDROXY (VIT D DEFICIENCY, FRACTURES): Vit D, 25-Hydroxy: 47.07 ng/mL (ref 30–100)

## 2019-12-15 NOTE — Progress Notes (Signed)
ANTICOAGULATION CONSULT NOTE - Follow Up Consult  Pharmacy Consult for Heparin Indication: pulmonary embolus and DVT  No Known Allergies  Patient Measurements: Height: 6' (182.9 cm) Weight: 236 lb 5.3 oz (107.2 kg) IBW/kg (Calculated) : 77.6 HEPARIN DW (KG): 100.1   Vital Signs: Temp: 97.6 F (36.4 C) (03/01 0800) Temp Source: Oral (03/01 0800) BP: 146/109 (03/01 0900) Pulse Rate: 145 (03/01 1101)  Labs: Recent Labs     0000 12/12/19 1132 12/13/19 0220 12/13/19 0908 12/13/19 1400 12/14/19 0336 12/14/19 1511 12/15/19 0302 12/15/19 0558  HGB   < >  --  15.4  --   --  14.1  --   --  14.1  HCT  --   --  46.8  --   --  42.1  --   --  42.3  PLT  --   --  155  --   --  170  --   --  182  HEPARINUNFRC  --   --  0.16*  --    < > 0.69 0.57 0.51  --   CREATININE  --   --   --  1.09  --  1.03  --  0.95  --   TROPONINIHS  --  128*  --   --   --   --   --   --   --    < > = values in this interval not displayed.    Estimated Creatinine Clearance: 104.6 mL/min (by C-G formula based on SCr of 0.95 mg/dL).  No anticoag PTA  Assessment: 58 y.o.male with chief complaint shortness of breath and leg swelling. COVID infection diagnosed 2 weeks ago. Venous duplex consistent with LLE DVT and CT showed bilateral acute PE with evidence of right heart strain.  PMH significant for saddle PE in 2019.  He was on Eliquis x2 years but stopped anticoagulation in Dec 2020.  Baseline labs: Hgb 18 and plts 149. INR, aPTT at baseline.  Pharmacy has been consulted for IV heparin dosing.   12/15/2019:  Heparin level therapeutic (0.51) on IV heparin at 1800 units/hr  CBC: WNL  No bleeding or infusion related issues reported by RN  Has been on heparin > 72 hours   Goal of Therapy:  Heparin level 0.3-0.7 units/ml Monitor platelets by anticoagulation protocol: Yes   Plan:  Continue heparin infusion at 1800 units/hr Daily heparin level & CBC while on heparin F/U plans for long-term  anticoagulation - was on Eliquis in the past  Herby Abraham, Pharm.D [phone] 12/15/2019 11:06 AM

## 2019-12-15 NOTE — Progress Notes (Addendum)
PROGRESS NOTE    Duane Gregory  FUX:[id number] DOB: [birth year] DOA: 12/11/2019 PCP: Richmond Campbell., PA-C   Brief Narrative: 59 year old with past medical history significant for nephrolithiasis, pulmonary embolism diagnosed 2 years ago and was on anticoagulation for 2 years.  He completed his treatment on December 2020.  2 weeks ago patient was diagnosed with Covid infection that was managed supportively at home.  Over the course of 2 weeks he has developed progressive shortness of breath leading to his current presentation to the hospital.  CT chest done showed submassive bilateral PE with right heart strain.  Doppler lower extremity PVR acute left femoral DVT.   Assessment & Plan:   Active Problems:   Acute pulmonary embolism (HCC)   1-Submassive PE; right side heart strain; Echocardiogram shows severely reduced right ventricular function.  CCM consulted. No indication for thrombolysis at this time.  Troponin trending down. Continue with heparin drip. For at least 72 hours.  Vital stable.  Oxygen sat 94 on room air Discussed with Dr Sherene Sires, we can use NOAC, for treatment.  Plan to continue with heparin today and transition to ELiquis on 3-02. Work with PT, HR increase up to 140, after he rest HR decreased. Currently HR 90  2-Viral COVID-19 infection: First few days of diagnosis patient was having subjective fever, cough, muscle pain. He continues to have mild cough.  Yesterday he developed worsening shortness of breath. -He was diagnosed with Covid on February 10. Day 16 of infection.  -CRP is elevated 16--13--9--5, D-dimer has decreased to 5.7.---5.0 -Continue with Remdesivir day 4.   -Started IV decadron Oxygen sat was at 91--94 RA  3-Metabolic Acidosis; he report diarrhea.  Continue with IV fluids. Improved, bicarb at 20.  Improved.   4-Left femoral DVT; on heparin Gtt. Transition to eliquis on 3-02.  5-AKI; continue with IV fluids. Improved.   6-Diarrhea; likely  related to Covid.  Support care.  Improved.   7-Elevated Blood pressure;  Might be related to steroids.  Received a dose of Hydralazine overnight.  IF BP continue to be elevated, might need BP medication at discharge.   Estimated body mass index is 32.05 kg/m as calculated from the following:   Height as of this encounter: 6' (1.829 m).   Weight as of this encounter: 107.2 kg.   DVT prophylaxis: Heparin Gtt Code Status: Full code Family Communication: Care discussed with patient, I have been updating his friend per his request.  Disposition Plan:  Patient is from: Home  Anticipated d/c date: in  5 days  Barriers to d/c or necessity for inpatient status: submassive PE, requiring Heparin Gtt.   Consultants:   CCM  Procedures:   ECHO  Antimicrobials:    Subjective: He is breathing ok, diarrhea almost resolved.  He is willing to work with PT>  He is feeling ok. Report mild Headaches.    Objective: Vitals:   12/15/19 0800 12/15/19 0900 12/15/19 1101 12/15/19 1200  BP: (!) 144/89 (!) 146/109    Pulse: 73 73 (!) 145   Resp: 16 18    Temp: 97.6 F (36.4 C)   97.9 F (36.6 C)  TempSrc: Oral   Oral  SpO2: 97% 95%    Weight:      Height:        Intake/Output Summary (Last 24 hours) at 12/15/2019 1319 Last data filed at 12/15/2019 1000 Gross per 24 hour  Intake 3876.84 ml  Output 1125 ml  Net 2751.84 ml   [employer]  12/11/19 1548 12/12/19 0155  Weight: 111.1 kg 107.2 kg    Examination:  General exam: NAD Respiratory system:CTA Cardiovascular system: S 1, S 2 RRR Gastrointestinal system: BS present, soft, nt Central nervous system: Alert, oriented, follows command Extremities: No edema Skin: No rashes   Data Reviewed: I have personally reviewed following labs and imaging studies  CBC: Recent Labs  Lab 12/11/19 1656 12/12/19 0155 12/13/19 0220 12/14/19 0336 12/15/19 0558  WBC 8.1 7.9 7.0 6.0 6.3  NEUTROABS 6.6  --   --   --   --   HGB 18.0*  16.5 15.4 14.1 14.1  HCT 53.8* 50.0 46.8 42.1 42.3  MCV 86.6 85.6 86.5 86.6 86.9  PLT 149* 171 155 170 283   Basic Metabolic Panel: Recent Labs  Lab 12/11/19 1656 12/12/19 0155 12/13/19 0908 12/14/19 0336 12/15/19 0302  NA 141 143 143 142 143  K 4.3 3.8 3.7 3.8 3.5  CL 105 110 112* 114* 110  CO2 23 16* 20* 21* 23  GLUCOSE 118* 130* 126* 125* 123*  BUN 15 16 14 15 17   CREATININE 1.36* 1.30* 1.09 1.03 0.95  CALCIUM 9.0 8.7* 8.3* 8.1* 8.3*  MG  --  2.2  --   --   --   PHOS  --  3.2  --   --   --    GFR: Estimated Creatinine Clearance: 104.6 mL/min (by C-G formula based on SCr of 0.95 mg/dL). Liver Function Tests: No results for input(s): AST, ALT, ALKPHOS, BILITOT, PROT, ALBUMIN in the last 168 hours. No results for input(s): LIPASE, AMYLASE in the last 168 hours. No results for input(s): AMMONIA in the last 168 hours. Coagulation Profile: Recent Labs  Lab 12/11/19 1656  INR 1.0   Cardiac Enzymes: No results for input(s): CKTOTAL, CKMB, CKMBINDEX, TROPONINI in the last 168 hours. BNP (last 3 results) No results for input(s): PROBNP in the last 8760 hours. HbA1C: No results for input(s): HGBA1C in the last 72 hours. CBG: No results for input(s): GLUCAP in the last 168 hours. Lipid Profile: No results for input(s): CHOL, HDL, LDLCALC, TRIG, CHOLHDL, LDLDIRECT in the last 72 hours. Thyroid Function Tests: No results for input(s): TSH, T4TOTAL, FREET4, T3FREE, THYROIDAB in the last 72 hours. Anemia Panel: No results for input(s): VITAMINB12, FOLATE, FERRITIN, TIBC, IRON, RETICCTPCT in the last 72 hours. Sepsis Labs: No results for input(s): PROCALCITON, LATICACIDVEN in the last 168 hours.  Recent Results (from the past 240 hour(s))  Respiratory Panel by RT PCR (Flu A&B, Covid) - Nasopharyngeal Swab     Status: Abnormal   Collection Time: 12/12/19 12:24 AM   Specimen: Nasopharyngeal Swab  Result Value Ref Range Status   SARS Coronavirus 2 by RT PCR POSITIVE (A)  NEGATIVE Final    Comment: RESULT CALLED TO, READ BACK BY AND VERIFIED WITH: ALLIE FULLERTON @ 1517 ON 12/12/19 C VARNER (NOTE) SARS-CoV-2 target nucleic acids are DETECTED. SARS-CoV-2 RNA is generally detectable in upper respiratory specimens  during the acute phase of infection. Positive results are indicative of the presence of the identified virus, but do not rule out bacterial infection or co-infection with other pathogens not detected by the test. Clinical correlation with patient history and other diagnostic information is necessary to determine patient infection status. The expected result is Negative. Fact Sheet for Patients:  PinkCheek.be Fact Sheet for Healthcare Providers: GravelBags.it This test is not yet approved or cleared by the [place] FDA and  has been authorized for detection and/or diagnosis of  SARS-CoV-2 by FDA under an Emergency Use Authorization (EUA).  This EUA will remain in effect (meaning this test can be  used) for the duration of  the COVID-19 declaration under Section 564(b)(1) of the Act, 21 U.S.C. section 360bbb-3(b)(1), unless the authorization is terminated or revoked sooner.    Influenza A by PCR NEGATIVE NEGATIVE Final   Influenza B by PCR NEGATIVE NEGATIVE Final    Comment: (NOTE) The Xpert Xpress SARS-CoV-2/FLU/RSV assay is intended as an aid in  the diagnosis of influenza from Nasopharyngeal swab specimens and  should not be used as a sole basis for treatment. Nasal washings and  aspirates are unacceptable for Xpert Xpress SARS-CoV-2/FLU/RSV  testing. Fact Sheet for Patients: https://www.moore.com/ Fact Sheet for Healthcare Providers: https://www.young.biz/ This test is not yet approved or cleared by the [place] FDA and  has been authorized for detection and/or diagnosis of SARS-CoV-2 by  FDA under an Emergency Use Authorization (EUA).  This EUA will remain  in effect (meaning this test can be used) for the duration of the  Covid-19 declaration under Section 564(b)(1) of the Act, 21  U.S.C. section 360bbb-3(b)(1), unless the authorization is  terminated or revoked. Performed at [facility], [address] [place], [place] [address]          Radiology Studies: No results found.      Scheduled Meds: . albuterol  1 puff Inhalation Q6H  . vitamin C  500 mg Oral BID  . Chlorhexidine Gluconate Cloth  6 each Topical Daily  . cholecalciferol  1,000 Units Oral Daily  . dexamethasone (DECADRON) injection  6 mg Intravenous Daily  . dextromethorphan-guaiFENesin  1 tablet Oral BID  . pantoprazole  40 mg Oral BID  . zinc sulfate  220 mg Oral Daily   Continuous Infusions: . heparin 1,800 Units/hr (12/15/19 1000)  . remdesivir 100 mg in NS 100 mL Stopped (12/15/19 1049)     LOS: 4 days    Time spent: 35 minutes    Daizee Firmin A Treena Cosman, MD [facility]   If 7PM-7AM, please contact night-coverage [email or web address]  12/15/2019, 1:19 PM

## 2019-12-15 NOTE — Evaluation (Signed)
Physical Therapy Evaluation Patient Details Name: Duane Gregory MRN: [id number] DOB: [birth year] Today's Date: 12/15/2019   History of Present Illness  59 year old with past medical history significant for nephrolithiasis, pulmonary embolism diagnosed 2 years ago and was on anticoagulation for 2 years.  He completed his treatment on December 2020.  2 weeks ago patient was diagnosed with Covid infection that was managed supportively at home.  Over the course of 2 weeks he has developed progressive shortness of breath leading to his current presentation to the hospital.  CT chest done showed submassive bilateral PE with right heart strain.  Doppler lower extremity -acute left femoral DVT.  Clinical Impression  The patient is mobilizing well in room on RA. Patient does live in second level apartment woth 13 steps. Patient should progress well to return home. Pt admitted with above diagnosis. Pt currently with functional limitations due to the deficits listed below (see PT Problem List). Pt will benefit from skilled PT to increase their independence and safety with mobility to allow discharge to the venue listed below.    will practice stairs prior to DC as able..      Follow Up Recommendations  none    Equipment Recommendations    none   Recommendations for Other Services       Precautions / Restrictions Precautions Precaution Comments: monitor HR, on hep Restrictions Weight Bearing Restrictions: No      Mobility  Bed Mobility Overal bed mobility: Independent                Transfers Overall transfer level: Independent                  Ambulation/Gait Ambulation/Gait assistance: Supervision Gait Distance (Feet): 90 Feet Assistive device: IV Pole Gait Pattern/deviations: Step-through pattern     General Gait Details: wfl  Stairs            Wheelchair Mobility    Modified Rankin (Stroke Patients Only)       Balance Overall balance assessment: No  apparent balance deficits (not formally assessed)                                           Pertinent Vitals/Pain Pain Assessment: Faces Faces Pain Scale: Hurts a little bit Pain Location: L achilles Pain Descriptors / Indicators: Discomfort Pain Intervention(s): Monitored during session    Home Living Family/patient expects to be discharged to:: Private residence Living Arrangements: Children Available Help at Discharge: Family Type of Home: Apartment Home Access: Stairs to enter Entrance Stairs-Rails: Lawyer of Steps: 13 Home Layout: One level Home Equipment: None 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[employer], counselling business    Prior Function Level of Independence: Independent               Higher education careers adviser   Dominant Hand: Right    Extremity/Trunk Assessment   Upper Extremity Assessment Upper Extremity Assessment: Overall WFL for tasks assessed    Lower Extremity Assessment Lower Extremity Assessment: Overall WFL for tasks assessed    Cervical / Trunk Assessment Cervical / Trunk Assessment: Normal  Communication   Communication: No difficulties  Cognition Arousal/Alertness: Awake/alert Behavior During Therapy: WFL for tasks assessed/performed Overall Cognitive Status: Within Functional Limits for tasks assessed  General Comments      Exercises     Assessment/Plan    PT Assessment Patient needs continued PT services  PT Problem List Decreased mobility;Decreased activity tolerance;Cardiopulmonary status limiting activity       PT Treatment Interventions Gait training;Stair training;Functional mobility training;Patient/family education    PT Goals (Current goals can be found in the Care Plan section)  Acute Rehab PT Goals Patient Stated Goal: to be able to go home and back to work PT Goal Formulation: With patient Time For Goal Achievement:  12/29/19 Potential to Achieve Goals: Good    Frequency Min 3X/week   Barriers to discharge        Co-evaluation   Reason for Co-Treatment: To address functional/ADL transfers PT goals addressed during session: Mobility/safety with mobility;Balance OT goals addressed during session: ADL's and self-care;Strengthening/ROM       AM-PAC PT "6 Clicks" Mobility  Outcome Measure Help needed turning from your back to your side while in a flat bed without using bedrails?: None Help needed moving from lying on your back to sitting on the side of a flat bed without using bedrails?: None Help needed moving to and from a bed to a chair (including a wheelchair)?: None Help needed standing up from a chair using your arms (e.g., wheelchair or bedside chair)?: None Help needed to walk in hospital room?: None Help needed climbing 3-5 steps with a railing? : A Little 6 Click Score: 23    End of Session   Activity Tolerance: Patient tolerated treatment well Patient left: in chair(w/ OT) Nurse Communication: Mobility status PT Visit Diagnosis: Difficulty in walking, not elsewhere classified (R26.2)    Time: 4818-5631 PT Time Calculation (min) (ACUTE ONLY): 37 min   Charges:   PT Evaluation $PT Eval Low Complexity: [place] Pager [phone] Office [phone]   Claretha Cooper 12/15/2019, 2:34 PM

## 2019-12-15 NOTE — Evaluation (Addendum)
Occupational Therapy Evaluation Patient Details Name: Duane Gregory MRN: [id number] DOB: Apr 14, 1961 Today's Date: 12/15/2019    History of Present Illness 59 year old with past medical history significant for nephrolithiasis, pulmonary embolism diagnosed 2 years ago and was on anticoagulation for 2 years.  He completed his treatment on December 2020.  2 weeks ago patient was diagnosed with Covid infection that was managed supportively at home.  Over the course of 2 weeks he has developed progressive shortness of breath leading to his current presentation to the hospital.  CT chest done showed submassive bilateral PE with right heart strain.  Doppler lower extremity PVR acute left femoral DVT.   Clinical Impression   This 59 yo male admitted with above presents to acute OT with with PLOF of totally independent with all basic ADLs, I IADLs, driving, and working full time. He currently is able to manage all of his basic ADLs just a a slower pace than normal due to fatigue. Education was provided on inspirometer (returned demo), flutter valve (returned demo), and energy conservation (verbalized understanding). Of note pt's HR did go as high as 145 while up with [place] in the room. No further OT needs, we will sign off.    Follow Up Recommendations  No OT follow up    Equipment Recommendations  None recommended by OT       Precautions / Restrictions Precautions Precaution Comments: monitor HR Restrictions Weight Bearing Restrictions: No      Mobility Bed Mobility Overal bed mobility: Independent                Transfers Overall transfer level: Independent                    Balance Overall balance assessment: No apparent balance deficits (not formally assessed)                                         ADL either performed or assessed with clinical judgement   ADL Overall ADL's : Modified independent                                        General ADL Comments: increased time due to fatigue factor. Educated pt on use of inspirometer and flutter valve (10 times each every hour he is awake). Provided pt with energy conservation handout and talked to him about some of the aspects on it (taking time, separating tasks--ie: showering, resting then dressing, may need to sit to shower depending on how fatiguing it is, not taking hot steamy showers, keeping door to bathroom open and bathroom vent fan running for air circulation. Pt asked about return to work--advised him to speak with his primary MD and/or MDs following him here.     Vision Patient Visual Report: No change from baseline              Pertinent Vitals/Pain Pain Assessment: No/denies pain     Hand Dominance Right   Extremity/Trunk Assessment Upper Extremity Assessment Upper Extremity Assessment: Overall WFL for tasks assessed   Lower Extremity Assessment Lower Extremity Assessment: Overall WFL for tasks assessed       Communication Communication Communication: No difficulties   Cognition Arousal/Alertness: Awake/alert Behavior During Therapy: WFL for tasks assessed/performed Overall Cognitive Status: Within Functional Limits for tasks assessed  Home Living Family/patient expects to be discharged to:: Private residence Living Arrangements: Children(son) Available Help at Discharge: Family Type of Home: House Home Access: Stairs to enter     [place]: One level     Bathroom Shower/Tub: Aeronautical engineer: None   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[place] Functioning/Environment Level of Independence: Independent                 OT Problem List: Cardiopulmonary status limiting activity;Decreased activity tolerance         OT Goals(Current goals can be found in the care plan section) Acute Rehab OT Goals Patient Stated  Goal: to be able to go home and back to work  OT Frequency:             Co-evaluation PT/OT/SLP Co-Evaluation/Treatment: Yes Reason for Co-Treatment: To address functional/ADL transfers PT goals addressed during session: Mobility/safety with mobility;Balance OT goals addressed during session: ADL's and self-care;Strengthening/ROM      AM-PAC OT "6 Clicks" Daily Activity     Outcome Measure Help from another person eating meals?: None Help from another person taking care of personal grooming?: None Help from another person toileting, which includes using toliet, bedpan, or urinal?: None Help from another person bathing (including washing, rinsing, drying)?: None Help from another person to put on and taking off regular upper body clothing?: None Help from another person to put on and taking off regular lower body clothing?: None 6 Click Score: 24   End of Session Equipment Utilized During Treatment: (pushed IV pole in room) Nurse Communication: Mobility status  Activity Tolerance: Patient tolerated treatment well Patient left: in chair;with call bell/phone within reach;with chair alarm set  OT Visit Diagnosis: Muscle weakness (generalized) (M62.81)                Time: 5681-2751 OT Time Calculation (min): 27 min Charges:  OT General Charges $OT Visit: 1 Visit OT Evaluation $OT Eval Moderate Complexity: 1 Mod  CathyOTR/L Acute Rehab Services Pager [phone] Office [phone]     12/15/2019, 11:09 AM

## 2019-12-16 LAB — CBC
HCT: 41.9 % (ref 39.0–52.0)
Hemoglobin: 13.7 g/dL (ref 13.0–17.0)
MCH: 28.5 pg (ref 26.0–34.0)
MCHC: 32.7 g/dL (ref 30.0–36.0)
MCV: 87.1 fL (ref 80.0–100.0)
Platelets: 188 10*3/uL (ref 150–400)
RBC: 4.81 MIL/uL (ref 4.22–5.81)
RDW: 13.9 % (ref 11.5–15.5)
WBC: 6.8 10*3/uL (ref 4.0–10.5)
nRBC: 0.4 % — ABNORMAL HIGH (ref 0.0–0.2)

## 2019-12-16 LAB — BASIC METABOLIC PANEL
Anion gap: 9 (ref 5–15)
BUN: 15 mg/dL (ref 6–20)
CO2: 23 mmol/L (ref 22–32)
Calcium: 8.6 mg/dL — ABNORMAL LOW (ref 8.9–10.3)
Chloride: 110 mmol/L (ref 98–111)
Creatinine, Ser: 1.05 mg/dL (ref 0.61–1.24)
GFR calc Af Amer: >60 mL/min (ref >60)
GFR calc non Af Amer: >60 mL/min (ref >60)
Glucose, Bld: 135 mg/dL — ABNORMAL HIGH (ref 70–99)
Potassium: 3.4 mmol/L — ABNORMAL LOW (ref 3.5–5.1)
Sodium: 142 mmol/L (ref 135–145)

## 2019-12-16 LAB — C-REACTIVE PROTEIN: CRP: 4 mg/dL — ABNORMAL HIGH (ref <1.0)

## 2019-12-16 LAB — HEPARIN LEVEL (UNFRACTIONATED): Heparin Unfractionated: 0.57 IU/mL (ref 0.30–0.70)

## 2019-12-16 LAB — D-DIMER, QUANTITATIVE: D-Dimer, Quant: 3.82 ug/mL-FEU — ABNORMAL HIGH (ref 0.00–0.50)

## 2019-12-16 MED ORDER — APIXABAN 5 MG PO TABS: 5.0000 mg | ORAL_TABLET | Freq: Two times a day (BID) | ORAL | Status: DC

## 2019-12-16 MED ORDER — AMLODIPINE BESYLATE 5 MG PO TABS
5.0000 mg | ORAL_TABLET | Freq: Every day | ORAL | Status: DC
  Administered 2019-12-16 – 2019-12-17 (×2): 5 mg via ORAL
  Filled 2019-12-16 (×2): qty 1

## 2019-12-16 MED ORDER — APIXABAN 5 MG PO TABS
10.0000 mg | ORAL_TABLET | Freq: Two times a day (BID) | ORAL | Status: DC
  Administered 2019-12-16 – 2019-12-17 (×3): 10 mg via ORAL
  Filled 2019-12-16 (×3): qty 2

## 2019-12-16 MED ORDER — POTASSIUM CHLORIDE CRYS ER 20 MEQ PO TBCR
40.0000 meq | EXTENDED_RELEASE_TABLET | Freq: Once | ORAL | Status: AC
  Administered 2019-12-16: 40 meq via ORAL
  Filled 2019-12-16: qty 2

## 2019-12-16 NOTE — Progress Notes (Signed)
Pt's heart rate increased from 80's SR to 115 ST while ambulating to bathroom, pt denies sob or chest pain and is on room air

## 2019-12-16 NOTE — Progress Notes (Addendum)
;   ANTICOAGULATION CONSULT NOTE - Follow Up Consult  Pharmacy Consult for Heparin>>apixaban Indication: pulmonary embolus and DVT  No Known Allergies  Patient Measurements: Height: 6' (182.9 cm) Weight: 236 lb 5.3 oz (107.2 kg) IBW/kg (Calculated) : 77.6 HEPARIN DW (KG): 100.1   Vital Signs: Temp: 98 F (36.7 C) (03/02 0000) Temp Source: Oral (03/02 0000) BP: 154/96 (03/02 0700) Pulse Rate: 64 (03/02 0700)  Labs: Recent Labs    12/14/19 0336 12/14/19 0336 12/14/19 1511 12/15/19 0302 12/15/19 0558 12/16/19 0157  HGB 14.1   < >  --   --  14.1 13.7  HCT 42.1  --   --   --  42.3 41.9  PLT 170  --   --   --  182 188  HEPARINUNFRC 0.69   < > 0.57 0.51  --  0.57  CREATININE 1.03  --   --  0.95  --  1.05   < > = values in this interval not displayed.    Estimated Creatinine Clearance: 94.6 mL/min (by C-G formula based on SCr of 1.05 mg/dL).  No anticoag PTA  Assessment: 59 y.o.male with chief complaint shortness of breath and leg swelling. COVID infection diagnosed 2 weeks ago. Venous duplex consistent with LLE DVT and CT showed bilateral acute PE with evidence of right heart strain.  PMH significant for saddle PE in 2019.  He was on Eliquis x2 years but stopped anticoagulation in Dec 2020.  Baseline labs: Hgb 18 and plts 149. INR, aPTT at baseline.  Pharmacy has been consulted for IV heparin dosing.   12/16/2019:  Heparin level therapeutic (0.57) on IV heparin at 1800 units/hr  CBC: WNL  No bleeding or infusion related issues reported by RN  Has been on heparin > 72 hours  To transition to apixaban today  Goal of Therapy:  Heparin level 0.3-0.7 units/ml Monitor platelets by anticoagulation protocol: Yes   Plan:  Dc heparin drip apixaban 10 mg po bid x 7 days then apixaban 5 mg po bid He does not qualify for 30 day free card because he has been on apixaban in the past. Will educate prior to discharge- was on apixaban for 2 years PTA but stopped 12/20.    Herby Abraham, Pharm.D [phone] 12/16/2019 7:59 AM

## 2019-12-16 NOTE — Progress Notes (Signed)
PROGRESS NOTE    Duane Gregory  SWN:[id number] DOB: 07/29/1961 DOA: 12/11/2019 PCP: Aletha Halim., PA-C   Brief Narrative: 59 year old with past medical history significant for nephrolithiasis, pulmonary embolism diagnosed 2 years ago and was on anticoagulation for 2 years.  He completed his treatment on December 2020.  2 weeks ago patient was diagnosed with Covid infection that was managed supportively at home.  Over the course of 2 weeks he has developed progressive shortness of breath leading to his current presentation to the hospital.  CT chest done showed submassive bilateral PE with right heart strain.  Doppler lower extremity PVR acute left femoral DVT.  Patient admitted with worsening cough, found to have submassive PE.  He was treated with IV heparin drip for 4 days.  He was transitioned to Eliquis today.  His vital signs remained stable.  Patient might be able to go home in 1 or 2 days if he continues to progress well.  Assessment & Plan:   Active Problems:   Acute pulmonary embolism ([place])   1-Submassive PE; right side heart strain; Echocardiogram shows severely reduced right ventricular function.  CCM consulted. No indication for thrombolysis at this time.  Troponin trending down. Patient was treated with heparin drip for 4 days.  Plan to transition to Eliquis today 12/16/2019 Vital stable.  Oxygen sat 94 on room air Discussed with Dr Melvyn Novas, we can use NOAC, for treatment.  Work with PT 3-01, HR increase up to 140, after he rest HR decreased. Currently HR 90. Rate today only increased to 115 on ambulation improved from yesterday.  2-Viral COVID-19 infection: First few days of diagnosis patient was having subjective fever, cough, muscle pain. He continues to have mild cough.  Yesterday he developed worsening shortness of breath. -He was diagnosed with Covid on February 10.  -CRP is elevated 16--13--9--5, D-dimer has decreased to 5.7.---5.0 -Continue with Remdesivir day  5.   -Started IV decadron Oxygen sat was at 91--94 RA Inflammatory markers continue to decrease.  3-Metabolic Acidosis; he report diarrhea.  Continue with IV fluids. Improved, bicarb at 20.  Improved.   4-Left femoral DVT; on heparin Gtt. Transition to eliquis on 3-02.  5-AKI; continue with IV fluids. Improved.   6-Diarrhea; likely related to Covid.  Support care.  Improved.   7-HTN; patient was taking Norvasc prior to admission.  Resumed today. Estimated body mass index is 32.05 kg/m as calculated from the following:   Height as of this encounter: 6' (1.829 m).   Weight as of this encounter: 107.2 kg. Hypokalemia: Replete orally.  DVT prophylaxis: Heparin Gtt Code Status: Full code Family Communication: Care discussed with patient, I have been updating his friend per his request.  Disposition Plan:  Patient is from: Home  Anticipated d/c date: in  5 days  Barriers to d/c or necessity for inpatient status: submassive PE, requiring Heparin Gtt.   Consultants:   CCM  Procedures:   ECHO  Antimicrobials:    Subjective: He is feeling better, denies shortness of breath.  Cough improved.    Objective: Vitals:   12/16/19 0200 12/16/19 0300 12/16/19 0400 12/16/19 0700  BP: 136/79 131/81 129/87 (!) 154/96  Pulse: 64 67 69 64  Resp: 16 15 20 16   Temp:      TempSrc:      SpO2: 97% 91% 96% 98%  Weight:      Height:        Intake/Output Summary (Last 24 hours) at 12/16/2019 0831 Last data filed at  12/15/2019 2000 Gross per 24 hour  Intake 542.12 ml  Output 451 ml  Net 91.12 ml   Filed Weights   12/11/19 1548 12/12/19 0155  Weight: 111.1 kg 107.2 kg    Examination:  General exam: NAD Respiratory system: CTA Cardiovascular system:  S 1, S 2 RRR Gastrointestinal system: BS present, soft, nt Central nervous system: Nonfocal Extremities: No edema Skin: No rashes   Data Reviewed: I have personally reviewed following labs and imaging studies  CBC: Recent  Labs  Lab 12/11/19 1656 12/11/19 1656 12/12/19 0155 12/13/19 0220 12/14/19 0336 12/15/19 0558 12/16/19 0157  WBC 8.1   < > 7.9 7.0 6.0 6.3 6.8  NEUTROABS 6.6  --   --   --   --   --   --   HGB 18.0*   < > 16.5 15.4 14.1 14.1 13.7  HCT 53.8*   < > 50.0 46.8 42.1 42.3 41.9  MCV 86.6   < > 85.6 86.5 86.6 86.9 87.1  PLT 149*   < > 171 155 170 182 188   < > = values in this interval not displayed.   Basic Metabolic Panel: Recent Labs  Lab 12/12/19 0155 12/13/19 0908 12/14/19 0336 12/15/19 0302 12/16/19 0157  NA 143 143 142 143 142  K 3.8 3.7 3.8 3.5 3.4*  CL 110 112* 114* 110 110  CO2 16* 20* 21* 23 23  GLUCOSE 130* 126* 125* 123* 135*  BUN 16 14 15 17 15   CREATININE 1.30* 1.09 1.03 0.95 1.05  CALCIUM 8.7* 8.3* 8.1* 8.3* 8.6*  MG 2.2  --   --   --   --   PHOS 3.2  --   --   --   --    GFR: Estimated Creatinine Clearance: 94.6 mL/min (by C-G formula based on SCr of 1.05 mg/dL). Liver Function Tests: No results for input(s): AST, ALT, ALKPHOS, BILITOT, PROT, ALBUMIN in the last 168 hours. No results for input(s): LIPASE, AMYLASE in the last 168 hours. No results for input(s): AMMONIA in the last 168 hours. Coagulation Profile: Recent Labs  Lab 12/11/19 1656  INR 1.0   Cardiac Enzymes: No results for input(s): CKTOTAL, CKMB, CKMBINDEX, TROPONINI in the last 168 hours. BNP (last 3 results) No results for input(s): PROBNP in the last 8760 hours. HbA1C: No results for input(s): HGBA1C in the last 72 hours. CBG: No results for input(s): GLUCAP in the last 168 hours. Lipid Profile: No results for input(s): CHOL, HDL, LDLCALC, TRIG, CHOLHDL, LDLDIRECT in the last 72 hours. Thyroid Function Tests: No results for input(s): TSH, T4TOTAL, FREET4, T3FREE, THYROIDAB in the last 72 hours. Anemia Panel: No results for input(s): VITAMINB12, FOLATE, FERRITIN, TIBC, IRON, RETICCTPCT in the last 72 hours. Sepsis Labs: No results for input(s): PROCALCITON, LATICACIDVEN in the last  168 hours.  Recent Results (from the past 240 hour(s))  Respiratory Panel by RT PCR (Flu A&B, Covid) - Nasopharyngeal Swab     Status: Abnormal   Collection Time: 12/12/19 12:24 AM   Specimen: Nasopharyngeal Swab  Result Value Ref Range Status   SARS Coronavirus 2 by RT PCR POSITIVE (A) NEGATIVE Final    Comment: RESULT CALLED TO, READ BACK BY AND VERIFIED WITH: ALLIE FULLERTON @ 0245 ON 12/12/19 C VARNER (NOTE) SARS-CoV-2 target nucleic acids are DETECTED. SARS-CoV-2 RNA is generally detectable in upper respiratory specimens  during the acute phase of infection. Positive results are indicative of the presence of the identified virus, but do not rule  out bacterial infection or co-infection with other pathogens not detected by the test. Clinical correlation with patient history and other diagnostic information is necessary to determine patient infection status. The expected result is Negative. Fact Sheet for Patients:  https://www.moore.com/ Fact Sheet for Healthcare Providers: https://www.young.biz/ This test is not yet approved or cleared by the [place] FDA and  has been authorized for detection and/or diagnosis of SARS-CoV-2 by FDA under an Emergency Use Authorization (EUA).  This EUA will remain in effect (meaning this test can be  used) for the duration of  the COVID-19 declaration under Section 564(b)(1) of the Act, 21 U.S.C. section 360bbb-3(b)(1), unless the authorization is terminated or revoked sooner.    Influenza A by PCR NEGATIVE NEGATIVE Final   Influenza B by PCR NEGATIVE NEGATIVE Final    Comment: (NOTE) The Xpert Xpress SARS-CoV-2/FLU/RSV assay is intended as an aid in  the diagnosis of influenza from Nasopharyngeal swab specimens and  should not be used as a sole basis for treatment. Nasal washings and  aspirates are unacceptable for Xpert Xpress SARS-CoV-2/FLU/RSV  testing. Fact Sheet for  Patients: https://www.moore.com/ Fact Sheet for Healthcare Providers: https://www.young.biz/ This test is not yet approved or cleared by the [place] FDA and  has been authorized for detection and/or diagnosis of SARS-CoV-2 by  FDA under an Emergency Use Authorization (EUA). This EUA will remain  in effect (meaning this test can be used) for the duration of the  Covid-19 declaration under Section 564(b)(1) of the Act, 21  U.S.C. section 360bbb-3(b)(1), unless the authorization is  terminated or revoked. Performed at [facility], [address] [place], [place] [address]          Radiology Studies: No results found.      Scheduled Meds: . albuterol  1 puff Inhalation Q6H  . apixaban  10 mg Oral BID   Followed by  . [START ON 12/23/2019] apixaban  5 mg Oral BID  . vitamin C  500 mg Oral BID  . Chlorhexidine Gluconate Cloth  6 each Topical Daily  . cholecalciferol  1,000 Units Oral Daily  . dexamethasone (DECADRON) injection  6 mg Intravenous Daily  . dextromethorphan-guaiFENesin  1 tablet Oral BID  . pantoprazole  40 mg Oral BID  . zinc sulfate  220 mg Oral Daily   Continuous Infusions: . heparin 1,800 Units/hr (12/16/19 0737)  . remdesivir 100 mg in NS 100 mL Stopped (12/15/19 1049)     LOS: 5 days    Time spent: 35 minutes    Deion Swift A Loukas Antonson, MD [facility]   If 7PM-7AM, please contact night-coverage [email or web address]  12/16/2019, 8:31 AM

## 2019-12-16 NOTE — Discharge Instructions (Signed)
Information on my medicine - ELIQUIS (apixaban)  This medication education was reviewed with me or my healthcare representative as part of my discharge preparation.   Why was Eliquis prescribed for you? Eliquis was prescribed to treat blood clots that may have been found in the veins of your legs (deep vein thrombosis) or in your lungs (pulmonary embolism) and to reduce the risk of them occurring again.  What do You need to know about Eliquis ? The starting dose is 10 mg (two 5 mg tablets) taken TWICE daily for the FIRST SEVEN (7) DAYS, then on Tuesday 12/23/19  the dose is reduced to ONE 5 mg tablet taken TWICE daily.  Eliquis may be taken with or without food.   Try to take the dose about the same time in the morning and in the evening. If you have difficulty swallowing the tablet whole please discuss with your pharmacist how to take the medication safely.  Take Eliquis exactly as prescribed and DO NOT stop taking Eliquis without talking to the doctor who prescribed the medication.  Stopping may increase your risk of developing a new blood clot.  Refill your prescription before you run out.  After discharge, you should have regular check-up appointments with your healthcare provider that is prescribing your Eliquis.    What do you do if you miss a dose? If a dose of ELIQUIS is not taken at the scheduled time, take it as soon as possible on the same day and twice-daily administration should be resumed. The dose should not be doubled to make up for a missed dose.  Important Safety Information A possible side effect of Eliquis is bleeding. You should call your healthcare provider right away if you experience any of the following: ? Bleeding from an injury or your nose that does not stop. ? Unusual colored urine (red or dark brown) or unusual colored stools (red or black). ? Unusual bruising for unknown reasons. ? A serious fall or if you hit your head (even if there is no  bleeding).  Some medicines may interact with Eliquis and might increase your risk of bleeding or clotting while on Eliquis. To help avoid this, consult your healthcare provider or pharmacist prior to using any new prescription or non-prescription medications, including herbals, vitamins, non-steroidal anti-inflammatory drugs (NSAIDs) and supplements.  This website has more information on Eliquis (apixaban): http://www.eliquis.com/eliquis/home

## 2019-12-16 NOTE — Progress Notes (Signed)
Received pt in room. No s/s of pain nor discomfort. Pt oriented to  room , call light within reach IS at bedside . Safety measures in place. Continue to monitor  pt .

## 2019-12-17 DIAGNOSIS — I82412 Acute embolism and thrombosis of left femoral vein: Secondary | ICD-10-CM

## 2019-12-17 DIAGNOSIS — N179 Acute kidney failure, unspecified: Secondary | ICD-10-CM

## 2019-12-17 DIAGNOSIS — U071 COVID-19: Secondary | ICD-10-CM

## 2019-12-17 DIAGNOSIS — I2602 Saddle embolus of pulmonary artery with acute cor pulmonale: Secondary | ICD-10-CM

## 2019-12-17 LAB — BASIC METABOLIC PANEL
Anion gap: 8 (ref 5–15)
BUN: 15 mg/dL (ref 6–20)
CO2: 23 mmol/L (ref 22–32)
Calcium: 8.4 mg/dL — ABNORMAL LOW (ref 8.9–10.3)
Chloride: 109 mmol/L (ref 98–111)
Creatinine, Ser: 1.07 mg/dL (ref 0.61–1.24)
GFR calc Af Amer: >60 mL/min (ref >60)
GFR calc non Af Amer: >60 mL/min (ref >60)
Glucose, Bld: 117 mg/dL — ABNORMAL HIGH (ref 70–99)
Potassium: 3.4 mmol/L — ABNORMAL LOW (ref 3.5–5.1)
Sodium: 140 mmol/L (ref 135–145)

## 2019-12-17 LAB — C-REACTIVE PROTEIN: CRP: 2.1 mg/dL — ABNORMAL HIGH (ref <1.0)

## 2019-12-17 LAB — D-DIMER, QUANTITATIVE: D-Dimer, Quant: 5.56 ug/mL-FEU — ABNORMAL HIGH (ref 0.00–0.50)

## 2019-12-17 MED ORDER — ALBUTEROL SULFATE HFA 108 (90 BASE) MCG/ACT IN AERS
2.0000 | INHALATION_SPRAY | Freq: Two times a day (BID) | RESPIRATORY_TRACT | Status: DC
  Administered 2019-12-17: 2 via RESPIRATORY_TRACT
  Filled 2019-12-17: qty 6.7

## 2019-12-17 MED ORDER — DEXAMETHASONE 6 MG PO TABS: 6.0000 mg | ORAL_TABLET | Freq: Two times a day (BID) | ORAL | 0 refills | Status: AC

## 2019-12-17 MED ORDER — APIXABAN 5 MG PO TABS: ORAL_TABLET | ORAL | 1 refills | Status: DC

## 2019-12-17 MED ORDER — ZINC SULFATE 220 (50 ZN) MG PO CAPS: 220.0000 mg | ORAL_CAPSULE | Freq: Every day | ORAL | 0 refills | Status: AC

## 2019-12-17 NOTE — Progress Notes (Signed)
Patients O2 sat at 97% before ambulating. Patient ambulated 100 ft in room and patient O2 sat remained above 96%.

## 2019-12-17 NOTE — Progress Notes (Signed)
Physical Therapy Treatment Patient Details Name: Duane Gregory MRN: [id number] DOB: 1961/08/15 Today's Date: 12/17/2019    History of Present Illness 59 year old with past medical history significant for nephrolithiasis, pulmonary embolism diagnosed 2 years ago and was on anticoagulation for 2 years.  He completed his treatment on December 2020.  2 weeks ago patient was diagnosed with Covid infection that was managed supportively at home.  Over the course of 2 weeks he has developed progressive shortness of breath leading to his current presentation to the hospital.  CT chest done showed submassive bilateral PE with right heart strain.  Doppler lower extremity -acute left femoral DVT.    PT Comments    Pt feeling better and hopes to go home today.  Pt has 13 steps to enter his apartment.  General stair comments: simulated 15 steps by using one step.  Pt was able to perform 15 step ups at a comfortable pace with no c/o dyspnea or dizziness.  HR only increased to 118 from rest 76 and RA avg 94%.  Tolerated very well.  Follow Up Recommendations  No PT follow up     Equipment Recommendations  None recommended by PT    Recommendations for Other Services       Precautions / Restrictions Precautions Precaution Comments: monitor vitals Restrictions Weight Bearing Restrictions: No    Mobility  Bed Mobility Overal bed mobility: Modified Independent                Transfers Overall transfer level: Modified independent                  Ambulation/Gait Ambulation/Gait assistance: Modified independent (Device/Increase time) Gait Distance (Feet): 25 Feet Assistive device: None Gait Pattern/deviations: Step-through pattern Gait velocity: WFL   General Gait Details: pt able to amb freely around room with no need for any AD and on RA   Stairs Stairs: Yes Stairs assistance: Supervision Stair Management: No rails Number of Stairs: 1 General stair comments: simulated 15  steps by using one step.  Pt was able to perform 15 step ups at a comfortable pace with no c/o dyspnea or dizziness.  HR only increased to 118 from rest 76 and RA avg 94%.  Tolerated very well.   Wheelchair Mobility    Modified Rankin (Stroke Patients Only)       Balance                                            Cognition Arousal/Alertness: Awake/alert Behavior During Therapy: WFL for tasks assessed/performed Overall Cognitive Status: Within Functional Limits for tasks assessed                                        Exercises      General Comments        Pertinent Vitals/Pain      Home Living                      Prior Function            PT Goals (current goals can now be found in the care plan section) Progress towards PT goals: Progressing toward goals    Frequency    Min 3X/week      PT Plan Current plan  remains appropriate    Co-evaluation              AM-PAC PT "6 Clicks" Mobility   Outcome Measure  Help needed turning from your back to your side while in a flat bed without using bedrails?: None Help needed moving from lying on your back to sitting on the side of a flat bed without using bedrails?: None Help needed moving to and from a bed to a chair (including a wheelchair)?: None Help needed standing up from a chair using your arms (e.g., wheelchair or bedside chair)?: None Help needed to walk in hospital room?: None Help needed climbing 3-5 steps with a railing? : A Little 6 Click Score: 23    End of Session Equipment Utilized During Treatment: Gait belt Activity Tolerance: Patient tolerated treatment well Patient left: in bed;with call bell/phone within reach   PT Visit Diagnosis: Difficulty in walking, not elsewhere classified (R26.2)     Time: 6151-8343 PT Time Calculation (min) (ACUTE ONLY): 14 min  Charges:  $Gait Training: 8-22 mins                     Felecia Shelling   PTA Acute  Rehabilitation Services Pager      [phone]  Office      [phone]

## 2019-12-17 NOTE — Discharge Summary (Addendum)
Duane Gregory MCN:[id number] DOB: 1961/01/19 DOA: 12/11/2019  PCP: Richmond Campbell., PA-C  Admit date: 12/11/2019 Discharge date: 12/17/2019  Admitted From: Home Disposition: Home  Recommendations for Outpatient Follow-up:  1. Follow up with PCP in 1-2 weeks 2. Eliquis 10 mg BID, start 5 mg BID on 3/9. Decadron x 5 days and supportive measures 3. Please obtain BMP/CBC in one week 4. Will recommend repeat TTE to assess right ventricle function in 1 to 2 months as outpatient.  Home Health:none  Equipment/Devices:none  Discharge Condition: Stable CODE STATUS: Full  Brief/Interim Summary: History of present illness:  Duane Gregory is a 59 y.o. year old male with medical history significant for nephrolithiasis, pulmonary embolism that was diagnosed about 2 years ago and the patient was on anticoagulation for 2 years (completed therapy on 09/2019), diagnosed with COVID-19 approximately 2 weeks prior to admission manage supportively at home who presented on 12/11/2019 with progressively worsening shortness of breath.  In the ED patient is afebrile, respiratory rate 26-35, tachycardia 124-128, with normal BP RT-PCR Covid test was positive, initial troponin of 197, hemoglobin 1.36, D-dimer greater than 20, BNP 112.  CTA chest showed bilateral acute PE consistent with acute PE with CT evidence of right heart strain.  Lower extremity venous studies positive for acute DVT of left femoral vein, otherwise chest x-ray was unremarkable.  Patient was admitted and started on IV heparin for acute PE and monitored due to right heart strain. 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 Likely provoked in the setting of recent Covid infection.  Given this is a repeat event patient would need lifetime anticoagulation. TTE showed significant right heart strain.  Patient was evaluated by pulmonary  consultants given his hemodynamics were stable with no hypoxia patient was not a candidate for thrombolytic therapy.  He was closely monitored on IV heparin for several days before transition to Eliquis a day prior to discharge.  We will continue 10 mg twice daily for total 7 days before transitioning to 5 mg twice daily daily.  Was able to maintain normal oxygen saturation with ambulation prior to discharge.  Will recommend repeat TTE to assess right ventricle function in 1 to 2 months as outpatient.  Covid-19 infection Diagnosed 2 weeks prior to admission.  Managed supportively here.  No severe respiratory symptoms other than occasional cough.  Started on IV Decadron here x5 days, will continue oral Decadron for additional 5 days and zinc  AKI, mild Likely in setting of decreased hydration as well as contrast dye exposure from CT chest.  1.3 on admission, resolved back to baseline of 0.9-1 on discharge.   Consultations:  PCCM  Procedures/Studies: TTE, 2/26 RV systolic function, severely reduced RV size severely enlarged, moderately elevated pulmonary arterial systolic pressure (estimated 68.9 mmHg), right atrial size moderately dilated   Bilateral venous duplex, 2/25 bilateral venous ultrasounds, acute DVT left femoral vein  Subjective: Occasional cough.  No shortness of breath.  No abdominal pain.  No bleeding. Discharge Exam: Vitals:   12/16/19 2136 12/17/19 0439  BP: (!) 131/92 (!) 141/87  Pulse: 88 70  Resp: 18 18  Temp: 98.1 F (36.7 C) 98.1 F (36.7 C)  SpO2: 96% 94%   Vitals:   12/16/19 1000 12/16/19 1121 12/16/19 2136 12/17/19 0439  BP: 133/78 138/87 (!) 131/92 (!) 141/87  Pulse: 92 87 88 70  Resp: 20  18 18   Temp:  97.8  F (36.6 C) 98.1 F (36.7 C) 98.1 F (36.7 C)  TempSrc:   Oral Oral  SpO2: 98% 97% 96% 94%  Weight:      Height:        General: Lying in bed, no apparent distress Eyes: EOMI, anicteric ENT: Oral Mucosa clear and moist Cardiovascular:  regular rate and rhythm, no murmurs, rubs or gallops, no edema, Respiratory: Normal respiratory effort on room air, lungs clear to auscultation bilaterally Abdomen: soft, non-distended, non-tender, normal bowel sounds Skin: No Rash Neurologic: Grossly no focal neuro deficit.Mental status AAOx3, speech normal, Psychiatric:Appropriate affect, and mood  Discharge Diagnoses:  Active Problems:   Acute pulmonary embolism [facility])    Discharge Instructions  Discharge Instructions    Diet - low sodium heart healthy   Complete by: As directed    Increase activity slowly   Complete by: As directed      Allergies as of 12/17/2019   No Known Allergies     Medication List    TAKE these medications   amLODipine 5 MG tablet Commonly known as: NORVASC Take 5 mg by mouth daily.   apixaban 5 MG Tabs tablet Commonly known as: Eliquis Take 2 tablets (10mg ) twice daily for 7 days, then 1 tablet (5mg ) twice daily starting 3/9 What changed:   how much to take  how to take this  when to take this  additional instructions   dexamethasone 6 MG tablet Commonly known as: DECADRON Take 1 tablet (6 mg total) by mouth 2 (two) times daily with a meal for 5 days.   HYDROcodone-homatropine 5-1.5 MG/5ML syrup Commonly known as: HYCODAN Take 5 mLs by mouth every 6 (six) hours as needed for cough.   pantoprazole 40 MG tablet Commonly known as: PROTONIX Take 40 mg by mouth daily.   zinc sulfate 220 (50 Zn) MG capsule Take 1 capsule (220 mg total) by mouth daily for 5 days. Start taking on: December 18, 2019      Follow-up Information    Nyoka Cowden, MD Follow up in 4 week(s).   Specialty: Pulmonary Disease Contact information: [address] [place] [address] [phone]        Richmond Campbell., PA-C Follow up in 1 week(s).   Specialty: Family Medicine Contact information: [address] [place] [address] [phone]          No Known  Allergies      The results of significant diagnostics from this hospitalization (including imaging, microbiology, ancillary and laboratory) are listed below for reference.     Microbiology: Recent Results (from the past 240 hour(s))  Respiratory Panel by RT PCR (Flu A&B, Covid) - Nasopharyngeal Swab     Status: Abnormal   Collection Time: 12/12/19 12:24 AM   Specimen: Nasopharyngeal Swab  Result Value Ref Range Status   SARS Coronavirus 2 by RT PCR POSITIVE (A) NEGATIVE Final    Comment: RESULT CALLED TO, READ BACK BY AND VERIFIED WITH: ALLIE FULLERTON @ 0245 ON 12/12/19 C VARNER (NOTE) SARS-CoV-2 target nucleic acids are DETECTED. SARS-CoV-2 RNA is generally detectable in upper respiratory specimens  during the acute phase of infection. Positive results are indicative of the presence of the identified virus, but do not rule out bacterial infection or co-infection with other pathogens not detected by the test. Clinical correlation with patient history and other diagnostic information is necessary to determine patient infection status. The expected result is Negative. Fact Sheet for Patients:  https://www.moore.com/ Fact Sheet  for Healthcare Providers: GravelBags.it This test is not yet approved or cleared by the [place] and  has been authorized for detection and/or diagnosis of SARS-CoV-2 by FDA under an Emergency Use Authorization (EUA).  This EUA will remain in effect (meaning this test can be  used) for the duration of  the COVID-19 declaration under Section 564(b)(1) of the Act, 21 U.S.C. section 360bbb-3(b)(1), unless the authorization is terminated or revoked sooner.    Influenza A by PCR NEGATIVE NEGATIVE Final   Influenza B by PCR NEGATIVE NEGATIVE Final    Comment: (NOTE) The Xpert Xpress SARS-CoV-2/FLU/RSV assay is intended as an aid in  the diagnosis of influenza from Nasopharyngeal swab specimens and   should not be used as a sole basis for treatment. Nasal washings and  aspirates are unacceptable for Xpert Xpress SARS-CoV-2/FLU/RSV  testing. Fact Sheet for Patients: PinkCheek.be Fact Sheet for Healthcare Providers: GravelBags.it This test is not yet approved or cleared by the [place] FDA and  has been authorized for detection and/or diagnosis of SARS-CoV-2 by  FDA under an Emergency Use Authorization (EUA). This EUA will remain  in effect (meaning this test can be used) for the duration of the  Covid-19 declaration under Section 564(b)(1) of the Act, 21  U.S.C. section 360bbb-3(b)(1), unless the authorization is  terminated or revoked. Performed at [facility], [place] [address], [place], [place] [address]      Labs: BNP (last 3 results) Recent Labs    12/11/19 1656  BNP 494.4*   Basic Metabolic Panel: Recent Labs  Lab 12/12/19 0155 12/12/19 0155 12/13/19 0908 12/14/19 0336 12/15/19 0302 12/16/19 0157 12/17/19 0354  NA 143   < > 143 142 143 142 140  K 3.8   < > 3.7 3.8 3.5 3.4* 3.4*  CL 110   < > 112* 114* 110 110 109  CO2 16*   < > 20* 21* 23 23 23   GLUCOSE 130*   < > 126* 125* 123* 135* 117*  BUN 16   < > 14 15 17 15 15   CREATININE 1.30*   < > 1.09 1.03 0.95 1.05 1.07  CALCIUM 8.7*   < > 8.3* 8.1* 8.3* 8.6* 8.4*  MG 2.2  --   --   --   --   --   --   PHOS 3.2  --   --   --   --   --   --    < > = values in this interval not displayed.   Liver Function Tests: No results for input(s): AST, ALT, ALKPHOS, BILITOT, PROT, ALBUMIN in the last 168 hours. No results for input(s): LIPASE, AMYLASE in the last 168 hours. No results for input(s): AMMONIA in the last 168 hours. CBC: Recent Labs  Lab 12/11/19 1656 12/11/19 1656 12/12/19 0155 12/13/19 0220 12/14/19 0336 12/15/19 0558 12/16/19 0157  WBC 8.1   < > 7.9 7.0 6.0 6.3 6.8  NEUTROABS 6.6  --   --   --   --   --   --   HGB  18.0*   < > 16.5 15.4 14.1 14.1 13.7  HCT 53.8*   < > 50.0 46.8 42.1 42.3 41.9  MCV 86.6   < > 85.6 86.5 86.6 86.9 87.1  PLT 149*   < > 171 155 170 182 188   < > = values in this interval not displayed.   Cardiac Enzymes: No results for input(s): CKTOTAL, CKMB, CKMBINDEX, TROPONINI  in the last 168 hours. BNP: Invalid input(s): POCBNP CBG: No results for input(s): GLUCAP in the last 168 hours. D-Dimer Recent Labs    12/16/19 0157 12/17/19 0354  DDIMER 3.82* 5.56*   Hgb A1c No results for input(s): HGBA1C in the last 72 hours. Lipid Profile No results for input(s): CHOL, HDL, LDLCALC, TRIG, CHOLHDL, LDLDIRECT in the last 72 hours. Thyroid function studies No results for input(s): TSH, T4TOTAL, T3FREE, THYROIDAB in the last 72 hours.  Invalid input(s): FREET3 Anemia work up No results for input(s): VITAMINB12, FOLATE, FERRITIN, TIBC, IRON, RETICCTPCT in the last 72 hours. Urinalysis    Component Value Date/Time   COLORURINE YELLOW 02/19/2018 1941   APPEARANCEUR CLEAR 02/19/2018 1941   LABSPEC 1.025 02/19/2018 1941   PHURINE 5.5 02/19/2018 1941   GLUCOSEU NEGATIVE 02/19/2018 1941   HGBUR MODERATE (A) 02/19/2018 1941   BILIRUBINUR SMALL (A) 02/19/2018 1941   KETONESUR >80 (A) 02/19/2018 1941   PROTEINUR NEGATIVE 02/19/2018 1941   NITRITE NEGATIVE 02/19/2018 1941   LEUKOCYTESUR NEGATIVE 02/19/2018 1941   Sepsis Labs Invalid input(s): PROCALCITONIN,  WBC,  LACTICIDVEN Microbiology Recent Results (from the past 240 hour(s))  Respiratory Panel by RT PCR (Flu A&B, Covid) - Nasopharyngeal Swab     Status: Abnormal   Collection Time: 12/12/19 12:24 AM   Specimen: Nasopharyngeal Swab  Result Value Ref Range Status   SARS Coronavirus 2 by RT PCR POSITIVE (A) NEGATIVE Final    Comment: RESULT CALLED TO, READ BACK BY AND VERIFIED WITH: ALLIE FULLERTON @ 0245 ON 12/12/19 C VARNER (NOTE) SARS-CoV-2 target nucleic acids are DETECTED. SARS-CoV-2 RNA is generally detectable in  upper respiratory specimens  during the acute phase of infection. Positive results are indicative of the presence of the identified virus, but do not rule out bacterial infection or co-infection with other pathogens not detected by the test. Clinical correlation with patient history and other diagnostic information is necessary to determine patient infection status. The expected result is Negative. Fact Sheet for Patients:  https://www.moore.com/ Fact Sheet for Healthcare Providers: https://www.young.biz/ This test is not yet approved or cleared by the [place] FDA and  has been authorized for detection and/or diagnosis of SARS-CoV-2 by FDA under an Emergency Use Authorization (EUA).  This EUA will remain in effect (meaning this test can be  used) for the duration of  the COVID-19 declaration under Section 564(b)(1) of the Act, 21 U.S.C. section 360bbb-3(b)(1), unless the authorization is terminated or revoked sooner.    Influenza A by PCR NEGATIVE NEGATIVE Final   Influenza B by PCR NEGATIVE NEGATIVE Final    Comment: (NOTE) The Xpert Xpress SARS-CoV-2/FLU/RSV assay is intended as an aid in  the diagnosis of influenza from Nasopharyngeal swab specimens and  should not be used as a sole basis for treatment. Nasal washings and  aspirates are unacceptable for Xpert Xpress SARS-CoV-2/FLU/RSV  testing. Fact Sheet for Patients: https://www.moore.com/ Fact Sheet for Healthcare Providers: https://www.young.biz/ This test is not yet approved or cleared by the [place] FDA and  has been authorized for detection and/or diagnosis of SARS-CoV-2 by  FDA under an Emergency Use Authorization (EUA). This EUA will remain  in effect (meaning this test can be used) for the duration of the  Covid-19 declaration under Section 564(b)(1) of the Act, 21  U.S.C. section 360bbb-3(b)(1), unless the authorization is   terminated or revoked. Performed at [facility], [address], [place], [place] [address]      Time coordinating discharge: Over  30 minutes  SIGNED:   Laverna Peace, MD  [facility] 12/17/2019, 3:18 PM Pager   If 7PM-7AM, please contact night-coverage [email or web address] Password [email or web address]

## 2019-12-17 NOTE — Progress Notes (Signed)
Discharge instructions given to pt and all questions were answered.  

## 2020-01-22 ENCOUNTER — Other Ambulatory Visit: Payer: Self-pay

## 2020-01-22 ENCOUNTER — Ambulatory Visit (INDEPENDENT_AMBULATORY_CARE_PROVIDER_SITE_OTHER): Payer: BC Managed Care – PPO | Admitting: Emergency Medicine

## 2020-01-22 ENCOUNTER — Encounter: Payer: Self-pay | Admitting: Emergency Medicine

## 2020-01-22 VITALS — BP 108/72 | HR 96 | Temp 97.1°F | Ht 72.0 in | Wt 253.6 lb

## 2020-01-22 DIAGNOSIS — I2602 Saddle embolus of pulmonary artery with acute cor pulmonale: Secondary | ICD-10-CM | POA: Diagnosis not present

## 2020-01-22 DIAGNOSIS — R911 Solitary pulmonary nodule: Secondary | ICD-10-CM | POA: Diagnosis not present

## 2020-01-22 DIAGNOSIS — I2729 Other secondary pulmonary hypertension: Secondary | ICD-10-CM

## 2020-01-22 NOTE — Assessment & Plan Note (Signed)
Noted in 2019 following pulmonary embolism, consistent with a pulmonary infarct with resolution on subsequent imaging

## 2020-01-22 NOTE — Patient Instructions (Signed)
Please continue your Eliquis 5 mg twice a day.  We will plan for you to stay on this lifelong We will check blood work today. We will repeat your echocardiogram in August 2021 to assess right heart function Follow with Dr. Delton Coombes in August 2021 after your echo to discuss the results or sooner if you have any problems.

## 2020-01-22 NOTE — Progress Notes (Signed)
Subjective:    Patient ID: Duane Gregory, male    DOB: [birth year], 59 y.o.   MRN: [id number]  HPI 59 year old man seen by me in May 2019 for submassive pulmonary embolism with an associated left upper lobe opacity concerning for pulmonary infarct versus nodule.  He was treated with Eliquis for 6 months.  It was felt that this was likely a provoked clot following a period of immobility.  He was diagnosed with COVID-19 in mid February, subsequently admitted with recurrent submassive pulmonary embolism and associated right heart strain 12/11/2019.  Found DVT in L leg as well. Echocardiogram showed preserved LVEF with RV systolic function severely reduced and an enlarged RV.  Thrombolysis was deferred.  He is now on Eliquis again, planning to treat life-long.   He feels much better, is able to exert now but is still limited some. He can carry groceries.    Review of Systems  Past Medical History:  Diagnosis Date  . History of kidney stones      History reviewed. No pertinent family history.   Social History   Socioeconomic History  . Marital status: Legally Separated    Spouse name: Not on file  . Number of children: Not on file  . Years of education: Not on file  . Highest education level: Not on file  Occupational History  . Not on file  Tobacco Use  . Smoking status: Never Smoker  . Smokeless tobacco: Never Used  Substance and Sexual Activity  . Alcohol use: Yes    Comment: occ  . Drug use: Never  . Sexual activity: Not on file  Other Topics Concern  . Not on file  Social History Narrative  . Not on file   Social Determinants of Health   Financial Resource Strain:   . Difficulty of Paying Living Expenses:   Food Insecurity:   . Worried About Programme researcher, broadcasting/film/video in the Last Year:   . Barista in the Last Year:   Transportation Needs:   . Freight forwarder (Medical):   Marland Kitchen Lack of Transportation (Non-Medical):   Physical Activity:   . Days of  Exercise per Week:   . Minutes of Exercise per Session:   Stress:   . Feeling of Stress :   Social Connections:   . Frequency of Communication with Friends and Family:   . Frequency of Social Gatherings with Friends and Family:   . Attends Religious Services:   . Active Member of Clubs or Organizations:   . Attends Banker Meetings:   Marland Kitchen Marital Status:   Intimate Partner Violence:   . Fear of Current or Ex-Partner:   . Emotionally Abused:   Marland Kitchen Physically Abused:   . Sexually Abused:     No Known Allergies   Outpatient Medications Prior to Visit  Medication Sig Dispense Refill  . amLODipine (NORVASC) 5 MG tablet Take 5 mg by mouth daily.    Marland Kitchen apixaban (ELIQUIS) 5 MG TABS tablet Take 2 tablets (10mg ) twice daily for 7 days, then 1 tablet (5mg ) twice daily starting 3/9 60 tablet 1  . pantoprazole (PROTONIX) 40 MG tablet Take 40 mg by mouth daily.    HYDROcodone-homatropine (HYCODAN) 5-1.5 MG/5ML syrup Take 5 mLs by mouth every 6 (six) hours as needed for cough.     No facility-administered medications prior to visit.         Objective:   Physical Exam  Vitals:   01/22/20 1529  BP: 108/72  Pulse: 96  Temp: (!) 97.1 F (36.2 C)  TempSrc: Temporal  SpO2: 97%  Weight: 253 lb 9.6 oz (115 kg)  Height: 6' (1.829 m)   Gen: Pleasant, well-nourished, in no distress,  normal affect  ENT: No lesions,  mouth clear,  oropharynx clear, no postnasal drip  Neck: No JVD, no stridor  Lungs: No use of accessory muscles, no crackles or wheezing on normal respiration, no wheeze on forced expiration  Cardiovascular: RRR, heart sounds normal, no murmur or gallops, no peripheral edema  Musculoskeletal: No deformities, no cyanosis or clubbing  Neuro: alert, awake, non focal  Skin: Warm, no lesions or rash     Assessment & Plan:  Pulmonary nodule Noted in 2019 following pulmonary embolism, consistent with a pulmonary infarct with resolution on subsequent  imaging  Acute saddle pulmonary embolism with acute cor pulmonale (HCC) Massive PE first in May 2019 and then again in February 2021.  He will need to be on Eliquis for life.  He had right heart strain/failure on his echocardiogram.  We will plan to repeat this to assess for improvement or resolution in August 2021.  We will check hypercoagulability genetics as this may be valuable information for him to share with family.  Baltazar Apo, MD, PhD 01/22/2020, 4:05 PM [facility] [phone] or if no answer [phone]

## 2020-01-22 NOTE — Assessment & Plan Note (Signed)
Massive PE first in May 2019 and then again in February 2021.  He will need to be on Eliquis for life.  He had right heart strain/failure on his echocardiogram.  We will plan to repeat this to assess for improvement or resolution in August 2021.  We will check hypercoagulability genetics as this may be valuable information for him to share with family.

## 2020-01-27 LAB — FACTOR 5 LEIDEN

## 2020-01-27 LAB — PROTHROMBIN GENE MUTATION

## 2020-01-27 LAB — ANTITHROMBIN III: AntiThromb III Func: 110 % normal (ref 80–135)

## 2020-02-05 ENCOUNTER — Other Ambulatory Visit: Payer: Self-pay | Admitting: Emergency Medicine

## 2020-02-11 ENCOUNTER — Telehealth: Payer: Self-pay | Admitting: Emergency Medicine

## 2020-02-11 ENCOUNTER — Other Ambulatory Visit (HOSPITAL_COMMUNITY): Payer: BC Managed Care – PPO

## 2020-02-11 NOTE — Telephone Encounter (Signed)
Per last ov note echo was to be performed in August. Echo has been scheduled 05/19/20. Informed patient that was Dr.Byrum's plan. Nothing further needed.   Assessment & Plan:  Pulmonary nodule Noted in 2019 following pulmonary embolism, consistent with a pulmonary infarct with resolution on subsequent imaging  Acute saddle pulmonary embolism with acute cor pulmonale (HCC) Massive PE first in May 2019 and then again in February 2021.  He will need to be on Eliquis for life.  He had right heart strain/failure on his echocardiogram.  We will plan to repeat this to assess for improvement or resolution in August 2021.  We will check hypercoagulability genetics as this may be valuable information for him to share with family.  Levy Pupa, MD, PhD 01/22/2020, 4:05 PM [facility] [phone] or if no answer [phone]

## 2020-03-05 ENCOUNTER — Telehealth: Payer: Self-pay | Admitting: Emergency Medicine

## 2020-03-05 NOTE — Telephone Encounter (Signed)
Spoke with patient regarding his lab's. Please see telephone encounter from today . Nothing else further needed.

## 2020-05-04 DIAGNOSIS — D225 Melanocytic nevi of trunk: Secondary | ICD-10-CM | POA: Diagnosis not present

## 2020-05-04 DIAGNOSIS — L905 Scar conditions and fibrosis of skin: Secondary | ICD-10-CM | POA: Diagnosis not present

## 2020-05-04 DIAGNOSIS — L814 Other melanin hyperpigmentation: Secondary | ICD-10-CM | POA: Diagnosis not present

## 2020-05-04 DIAGNOSIS — L821 Other seborrheic keratosis: Secondary | ICD-10-CM | POA: Diagnosis not present

## 2020-05-04 DIAGNOSIS — L570 Actinic keratosis: Secondary | ICD-10-CM | POA: Diagnosis not present

## 2020-05-19 ENCOUNTER — Other Ambulatory Visit: Payer: Self-pay

## 2020-05-19 ENCOUNTER — Ambulatory Visit (HOSPITAL_COMMUNITY): Payer: BC Managed Care – PPO | Attending: Cardiovascular Disease

## 2020-05-19 DIAGNOSIS — I2729 Other secondary pulmonary hypertension: Secondary | ICD-10-CM | POA: Diagnosis not present

## 2020-05-19 LAB — ECHOCARDIOGRAM COMPLETE
Area-P 1/2: 3.34 cm2
S' Lateral: 2.7 cm

## 2020-07-08 ENCOUNTER — Ambulatory Visit (INDEPENDENT_AMBULATORY_CARE_PROVIDER_SITE_OTHER): Payer: BC Managed Care – PPO | Admitting: Emergency Medicine

## 2020-07-08 ENCOUNTER — Other Ambulatory Visit: Payer: Self-pay

## 2020-07-08 ENCOUNTER — Encounter: Payer: Self-pay | Admitting: Emergency Medicine

## 2020-07-08 DIAGNOSIS — I2602 Saddle embolus of pulmonary artery with acute cor pulmonale: Secondary | ICD-10-CM | POA: Diagnosis not present

## 2020-07-08 NOTE — Patient Instructions (Signed)
Your echocardiogram shows significant improvement. Your pulmonary pressures have normalized. Your right ventricular size and function have significantly improved. They were both read as mildly reduced. Continue Eliquis. Unless there are any contraindications our plan is for you to remain on anticoagulation medication lifelong. Follow with Dr. Delton Coombes if you have any changes in your breathing or any other problems.

## 2020-07-08 NOTE — Assessment & Plan Note (Signed)
Massive PE x2. Significantly improved clinically with anticoagulation. His echocardiogram shows normalized pulmonary pressures, mild RV enlargement and mild decreased RV function. Both are improved compared with his previous echocardiogram. Plan to continue anticoagulation lifelong. He can have this followed by his PCP. I'll be happy to see him if he has any changes in his breathing, recurrent clot, edema, etc.  Your echocardiogram shows significant improvement. Your pulmonary pressures have normalized. Your right ventricular size and function have significantly improved. They were both read as mildly reduced. Continue Eliquis. Unless there are any contraindications our plan is for you to remain on anticoagulation medication lifelong. Follow with Dr. Delton Coombes if you have any changes in your breathing or any other problems.

## 2020-07-08 NOTE — Progress Notes (Signed)
   Subjective:    Patient ID: Duane Gregory, male    DOB: [birth year], 59 y.o.   MRN: [id number]  HPI 59 year old man seen by me in May 2019 for submassive pulmonary embolism with an associated left upper lobe opacity concerning for pulmonary infarct versus nodule.  He was treated with Eliquis for 6 months.  It was felt that this was likely a provoked clot following a period of immobility.  He was diagnosed with COVID-19 in mid February, subsequently admitted with recurrent submassive pulmonary embolism and associated right heart strain 12/11/2019.  Found DVT in L leg as well. Echocardiogram showed preserved LVEF with RV systolic function severely reduced and an enlarged RV.  Thrombolysis was deferred.  He is now on Eliquis again, planning to treat life-long.   He feels much better, is able to exert now but is still limited some. He can carry groceries.   ROV 07/08/20 --follow-up visit for 59 who had a submassive pulmonary embolism in 02/2018 associated with the left upper lobe pulmonary infarct.  Unfortunately had a recurrent submassive pulmonary embolism with a left lower extremity DVT in the setting of COVID-19.  He did not undergo thrombolysis.  He had right heart strain and enlargement consistent with significant secondary PAH.  We repeated his echocardiogram 05/19/2020 and I have reviewed, shows intact LV function 60-65%, mildly reduced RV function with mild RV enlargement.  The PASP is normal. He feels well, is exercising without SOB. Good compliance w his eliquis.    Review of Systems As per HPI     Objective:   Physical Exam  Vitals:   07/08/20 1627  BP: 128/78  Pulse: 67  Temp: (!) 97.5 F (36.4 C)  TempSrc: Temporal  SpO2: 97%  Weight: 239 lb 12.8 oz (108.8 kg)  Height: 6' (1.829 m)   Gen: Pleasant, well-nourished, in no distress,  normal affect  ENT: No lesions,  mouth clear,  oropharynx clear, no postnasal drip  Neck: No JVD, no stridor  Lungs: No use of accessory  muscles, no crackles or wheezing on normal respiration, no wheeze on forced expiration  Cardiovascular: RRR, heart sounds normal, no murmur or gallops, no peripheral edema  Musculoskeletal: No deformities, no cyanosis or clubbing  Neuro: alert, awake, non focal  Skin: Warm, no lesions or rash     Assessment & Plan:  Acute saddle pulmonary embolism with acute cor pulmonale (HCC) Massive PE x2. Significantly improved clinically with anticoagulation. His echocardiogram shows normalized pulmonary pressures, mild RV enlargement and mild decreased RV function. Both are improved compared with his previous echocardiogram. Plan to continue anticoagulation lifelong. He can have this followed by his PCP. I'll be happy to see him if he has any changes in his breathing, recurrent clot, edema, etc.  Your echocardiogram shows significant improvement. Your pulmonary pressures have normalized. Your right ventricular size and function have significantly improved. They were both read as mildly reduced. Continue Eliquis. Unless there are any contraindications our plan is for you to remain on anticoagulation medication lifelong. Follow with Dr. Delton Coombes if you have any changes in your breathing or any other problems.  Levy Pupa, MD, PhD 07/08/2020, 4:46 PM [facility] [phone] or if no answer [phone]

## 2020-10-26 ENCOUNTER — Other Ambulatory Visit: Payer: Self-pay | Admitting: Emergency Medicine

## 2020-10-27 ENCOUNTER — Other Ambulatory Visit: Payer: Self-pay | Admitting: Emergency Medicine

## 2020-11-03 ENCOUNTER — Telehealth: Payer: Self-pay | Admitting: Internal Medicine

## 2020-11-04 ENCOUNTER — Telehealth: Payer: Self-pay | Admitting: Emergency Medicine

## 2020-11-04 NOTE — Telephone Encounter (Signed)
Dr. Delton Coombes, please advise on this as Eliquis is no longer covered.

## 2020-11-05 MED ORDER — RIVAROXABAN 20 MG PO TABS: 20.0000 mg | ORAL_TABLET | Freq: Every day | ORAL | 2 refills | Status: DC

## 2020-11-05 NOTE — Telephone Encounter (Signed)
Called and spoke with pt and he is aware of change from eliquis due to his insurance not covering this to xarelto per RB.  Pt is aware that this medication has been sent to the pharmacy and nothing further is needed.

## 2020-11-05 NOTE — Telephone Encounter (Signed)
Called and spoke with pt letting him know that we were still waiting for response from RB. Stated to pt that RB was not in office yesterday 1/20 but he is in clinic today 1/21 so we should receive a response from him today in regards to pt's blood thinner due to Eliquis no longer being covered.  Will wait for response from Dr. Delton Coombes in regards to recommendations.

## 2020-11-05 NOTE — Telephone Encounter (Signed)
The best substitute to make is Xarelto 20mg  once a day. Order this for him. We will have to see how his insurance covers.

## 2020-11-11 NOTE — Telephone Encounter (Signed)
Nothing noted in message. Will close encounter.  

## 2021-01-31 ENCOUNTER — Other Ambulatory Visit: Payer: Self-pay | Admitting: Emergency Medicine

## 2021-02-07 DIAGNOSIS — Z86711 Personal history of pulmonary embolism: Secondary | ICD-10-CM | POA: Diagnosis not present

## 2021-02-07 DIAGNOSIS — Z79899 Other long term (current) drug therapy: Secondary | ICD-10-CM | POA: Diagnosis not present

## 2021-02-07 DIAGNOSIS — Z8249 Family history of ischemic heart disease and other diseases of the circulatory system: Secondary | ICD-10-CM | POA: Diagnosis not present

## 2021-02-07 DIAGNOSIS — Z809 Family history of malignant neoplasm, unspecified: Secondary | ICD-10-CM | POA: Diagnosis not present

## 2021-02-07 DIAGNOSIS — Z7901 Long term (current) use of anticoagulants: Secondary | ICD-10-CM | POA: Diagnosis not present

## 2021-02-07 DIAGNOSIS — Z0000 Encounter for general adult medical examination without abnormal findings: Secondary | ICD-10-CM | POA: Diagnosis not present

## 2021-02-07 DIAGNOSIS — R2242 Localized swelling, mass and lump, left lower limb: Secondary | ICD-10-CM | POA: Diagnosis not present

## 2021-02-07 DIAGNOSIS — Z131 Encounter for screening for diabetes mellitus: Secondary | ICD-10-CM | POA: Diagnosis not present

## 2021-02-07 DIAGNOSIS — Z8616 Personal history of COVID-19: Secondary | ICD-10-CM | POA: Diagnosis not present

## 2021-02-07 DIAGNOSIS — K219 Gastro-esophageal reflux disease without esophagitis: Secondary | ICD-10-CM | POA: Diagnosis not present

## 2021-02-07 DIAGNOSIS — R351 Nocturia: Secondary | ICD-10-CM | POA: Diagnosis not present

## 2021-02-07 DIAGNOSIS — Z125 Encounter for screening for malignant neoplasm of prostate: Secondary | ICD-10-CM | POA: Diagnosis not present

## 2021-02-07 DIAGNOSIS — Z1329 Encounter for screening for other suspected endocrine disorder: Secondary | ICD-10-CM | POA: Diagnosis not present

## 2021-02-07 DIAGNOSIS — I10 Essential (primary) hypertension: Secondary | ICD-10-CM | POA: Diagnosis not present

## 2021-02-07 DIAGNOSIS — Z0001 Encounter for general adult medical examination with abnormal findings: Secondary | ICD-10-CM | POA: Diagnosis not present

## 2021-02-07 DIAGNOSIS — M7989 Other specified soft tissue disorders: Secondary | ICD-10-CM | POA: Diagnosis not present

## 2021-02-17 DIAGNOSIS — Z23 Encounter for immunization: Secondary | ICD-10-CM | POA: Diagnosis not present

## 2021-04-30 ENCOUNTER — Other Ambulatory Visit: Payer: Self-pay | Admitting: Emergency Medicine

## 2021-05-05 DIAGNOSIS — L905 Scar conditions and fibrosis of skin: Secondary | ICD-10-CM | POA: Diagnosis not present

## 2021-05-05 DIAGNOSIS — L821 Other seborrheic keratosis: Secondary | ICD-10-CM | POA: Diagnosis not present

## 2021-05-05 DIAGNOSIS — L814 Other melanin hyperpigmentation: Secondary | ICD-10-CM | POA: Diagnosis not present

## 2021-05-05 DIAGNOSIS — D485 Neoplasm of uncertain behavior of skin: Secondary | ICD-10-CM | POA: Diagnosis not present

## 2021-05-05 DIAGNOSIS — L570 Actinic keratosis: Secondary | ICD-10-CM | POA: Diagnosis not present

## 2021-05-05 DIAGNOSIS — D1801 Hemangioma of skin and subcutaneous tissue: Secondary | ICD-10-CM | POA: Diagnosis not present

## 2021-05-05 DIAGNOSIS — D225 Melanocytic nevi of trunk: Secondary | ICD-10-CM | POA: Diagnosis not present

## 2021-06-10 DIAGNOSIS — D485 Neoplasm of uncertain behavior of skin: Secondary | ICD-10-CM | POA: Diagnosis not present

## 2021-06-10 DIAGNOSIS — L905 Scar conditions and fibrosis of skin: Secondary | ICD-10-CM | POA: Diagnosis not present

## 2021-07-15 ENCOUNTER — Other Ambulatory Visit: Payer: Self-pay | Admitting: Emergency Medicine

## 2021-07-21 ENCOUNTER — Other Ambulatory Visit: Payer: Self-pay | Admitting: Emergency Medicine

## 2021-07-29 ENCOUNTER — Other Ambulatory Visit: Payer: Self-pay | Admitting: Emergency Medicine

## 2021-07-29 ENCOUNTER — Telehealth: Payer: Self-pay | Admitting: Emergency Medicine

## 2021-07-29 MED ORDER — RIVAROXABAN 20 MG PO TABS: 20.0000 mg | ORAL_TABLET | Freq: Every day | ORAL | 2 refills | Status: DC

## 2021-07-29 NOTE — Telephone Encounter (Signed)
Refill has been sent to the pharmacy for the pt.  Nothing further is needed.  

## 2021-10-21 ENCOUNTER — Other Ambulatory Visit: Payer: Self-pay | Admitting: Emergency Medicine

## 2021-12-15 ENCOUNTER — Other Ambulatory Visit: Payer: Self-pay | Admitting: Emergency Medicine

## 2021-12-20 ENCOUNTER — Telehealth: Payer: Self-pay | Admitting: Emergency Medicine

## 2021-12-20 MED ORDER — RIVAROXABAN 20 MG PO TABS: 20.0000 mg | ORAL_TABLET | Freq: Every day | ORAL | 1 refills | Status: DC

## 2021-12-20 NOTE — Telephone Encounter (Signed)
Called patient and notified him that I refilled his medication and sent to his pharmacy. Nothing further needed  ?

## 2022-02-09 DIAGNOSIS — Z1211 Encounter for screening for malignant neoplasm of colon: Secondary | ICD-10-CM | POA: Diagnosis not present

## 2022-02-09 DIAGNOSIS — Z23 Encounter for immunization: Secondary | ICD-10-CM | POA: Diagnosis not present

## 2022-02-09 DIAGNOSIS — Z125 Encounter for screening for malignant neoplasm of prostate: Secondary | ICD-10-CM | POA: Diagnosis not present

## 2022-02-09 DIAGNOSIS — R739 Hyperglycemia, unspecified: Secondary | ICD-10-CM | POA: Diagnosis not present

## 2022-02-09 DIAGNOSIS — Z0001 Encounter for general adult medical examination with abnormal findings: Secondary | ICD-10-CM | POA: Diagnosis not present

## 2022-02-09 DIAGNOSIS — Z13220 Encounter for screening for lipoid disorders: Secondary | ICD-10-CM | POA: Diagnosis not present

## 2022-02-09 DIAGNOSIS — Z0000 Encounter for general adult medical examination without abnormal findings: Secondary | ICD-10-CM | POA: Diagnosis not present

## 2022-02-09 DIAGNOSIS — K219 Gastro-esophageal reflux disease without esophagitis: Secondary | ICD-10-CM | POA: Diagnosis not present

## 2022-02-09 DIAGNOSIS — I10 Essential (primary) hypertension: Secondary | ICD-10-CM | POA: Diagnosis not present

## 2022-02-16 ENCOUNTER — Other Ambulatory Visit: Payer: Self-pay | Admitting: Emergency Medicine

## 2022-02-16 NOTE — Telephone Encounter (Signed)
Refill request received for pt's Xarelto. Pt has not been seen at office since 07/08/20.  Due to the type of medication that it is, will go ahead and refill med. ? ?Pt will require an appointment for further refills. Routing to front desk pool to help try to get an appt scheduled with either Dr. Delton Coombes or one of our APPs. ?

## 2022-02-17 NOTE — Telephone Encounter (Signed)
Patient scheduled 02/23/2022 at 4:30pm with Tammy Parrett- nothing further needed. ?

## 2022-02-23 ENCOUNTER — Ambulatory Visit (INDEPENDENT_AMBULATORY_CARE_PROVIDER_SITE_OTHER): Payer: BC Managed Care – PPO | Admitting: Adult Health

## 2022-02-23 ENCOUNTER — Encounter: Payer: Self-pay | Admitting: Adult Health

## 2022-02-23 DIAGNOSIS — I82412 Acute embolism and thrombosis of left femoral vein: Secondary | ICD-10-CM | POA: Diagnosis not present

## 2022-02-23 DIAGNOSIS — I872 Venous insufficiency (chronic) (peripheral): Secondary | ICD-10-CM | POA: Diagnosis not present

## 2022-02-23 DIAGNOSIS — I2692 Saddle embolus of pulmonary artery without acute cor pulmonale: Secondary | ICD-10-CM | POA: Diagnosis not present

## 2022-02-23 MED ORDER — RIVAROXABAN 20 MG PO TABS: 20.0000 mg | ORAL_TABLET | Freq: Every day | ORAL | 3 refills | Status: DC

## 2022-02-23 NOTE — Assessment & Plan Note (Signed)
Patient is encouraged on activity as tolerated.  Low-salt diet.  Keep legs elevated as needed.  Support stockings ?

## 2022-02-23 NOTE — Patient Instructions (Addendum)
Continue on Xarelto 20 mg daily ?Activity as tolerated ?Support stockings ?Avoid nonsteroidal medication such as ibuprofen, Aleve, Excedrin, Motrin, etc. ?Follow-up with Dr. Delton Coombes in 1 year and as needed ? ?

## 2022-02-23 NOTE — Assessment & Plan Note (Signed)
History left DVT.  On lifelong anticoagulation.  Patient has some residual venous insufficiency in his left leg.  Continue with Ted support stockings. ?

## 2022-02-23 NOTE — Assessment & Plan Note (Signed)
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 ?Repeat 2D echo 2021 showed improvement in pulmonary artery pressures and right ventricular systolic function. ? ?Plan  ?Patient Instructions  ?Continue on Xarelto 20 mg daily ?Activity as tolerated ?Support stockings ?Avoid nonsteroidal medication such as ibuprofen, Aleve, Excedrin, Motrin, etc. ?Follow-up with Dr. Delton Coombes in 1 year and as needed ? ?  ? ?

## 2022-02-23 NOTE — Progress Notes (Signed)
? ?@Patient  ID: Duane Gregory, male    DOB: September 17, 1961, 61 y.o.   MRN: [id number] ? ?Chief Complaint  ?Patient presents with  ? Follow-up  ? ? ?Referring provider: ?Aletha Halim., PA-C ? ?HPI: ?61 year old male followed for recurrent PE.  Had submassive PE May 2019 associated with a left upper lobe opacity concerning for pulmonary infarct versus nodule.  Treated with 6 months of Eliquis.  Felt this was provoked.  He was diagnosed with COVID-19 infection in February 2021.  Found to have recurrent submassive PE with associated right heart strain February 2021.  Also DVT.  Patient was started on Eliquis with plans for lifelong anticoagulation therapy ? ?TEST/EVENTS :  ?2D echo December 12, 2019 EF at 123456, RV systolic function severely reduced, RV size severely enlarged and moderately elevated pulmonary artery systolic pressure ? ?2D echo August 2021 EF 60 to 65%.  Right ventricular systolic function mildly reduced, RV size is mildly enlarged and normal pulmonary artery systolic pressure. ? ?CT chest December 11, 2019 bilateral acute pulmonary emboli most pronounced within the right middle, right lower and left lower lobe.  Positive right heart strain, left lower lobe atelectasis versus developing infarct.  Previous left upper lobe lung nodule resolved.  Fatty liver. ? ?02/23/2022 Follow up : Recurrent PE  ?Patient returns for a follow-up visit.  Patient was last seen September 2021.  Patient has a known history of recurrent submassive PE.  He is on lifelong anticoagulation with Eliquis.  Patient says overall since last visit he is doing well.  Says he remain active but is busy with his work.  He denies any significant shortness of breath.  Denies any chest pain hemoptysis.  Patient says he does wear TED stockings because he gets some swelling in his left leg since he had a DVT. ?He denies any known bleeding. ?Patient is now on Xarelto due to insurance issues.  Request a refill. ?Patient does have a family  history of VTE.  Sister and him both have factor V Leiden variant .  ? ? ?No Known Allergies ? ?Immunization History  ?Administered Date(s) Administered  ? Influenza,inj,Quad PF,6+ Mos 07/15/2021  ? Influenza,inj,Quad PF,6-35 Mos 08/17/2019  ? Influenza-Unspecified 08/16/2016  ? PFIZER Comirnaty(Gray Top)Covid-19 Tri-Sucrose Vaccine 01/10/2020, 01/31/2020, 02/17/2021  ? PFIZER(Purple Top)SARS-COV-2 Vaccination 01/11/2020, 01/31/2020, 08/17/2020  ? 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 Tdap 11/17/2011, 02/09/2022  ? Zoster Recombinat (Shingrix) 02/09/2022  ? ? ?Past Medical History:  ?Diagnosis Date  ? History of kidney stones   ? ? ?Tobacco History: ?Social History  ? ?Tobacco Use  ?Smoking Status Never  ?Smokeless Tobacco Never  ? ?Counseling given: Not Answered ? ? ?Outpatient Medications Prior to Visit  ?Medication Sig Dispense Refill  ? amLODipine (NORVASC) 5 MG tablet Take 5 mg by mouth daily.    ? pantoprazole (PROTONIX) 40 MG tablet Take 40 mg by mouth daily.    ? rivaroxaban (XARELTO) 20 MG TABS tablet TAKE 1 TABLET (20 MG TOTAL) BY MOUTH DAILY WITH SUPPER. APPOINTMENT BEFORE FUTURE REFILLS! 30 tablet 0  ? ?No facility-administered medications prior to visit.  ? ? ? ?Review of Systems:  ? ?Constitutional:   No  weight loss, night sweats,  Fevers, chills, fatigue, or  lassitude. ? ?HEENT:   No headaches,  Difficulty swallowing,  Tooth/dental problems, or  Sore throat,  ?              No sneezing, itching, ear ache, nasal congestion, post  nasal drip,  ? ?CV:  No chest pain,  Orthopnea, PND, swelling in lower extremities, anasarca, dizziness, palpitations, syncope.  ? ?GI  No heartburn, indigestion, abdominal pain, nausea, vomiting, diarrhea, change in bowel habits, loss of appetite, bloody stools.  ? ?Resp: No shortness of breath with exertion or at rest.  No excess mucus, no productive cough,  No non-productive cough,  No coughing up of blood.  No change in color of mucus.  No  wheezing.  No chest wall deformity ? ?Skin: no rash or lesions. ? ?GU: no dysuria, change in color of urine, no urgency or frequency.  No flank pain, no hematuria  ? ?MS:  No joint pain or swelling.  No decreased range of motion.  No back pain. ? ? ? ?Physical Exam ? ?BP 124/74 (BP Location: Left Arm, Patient Position: Sitting, Cuff Size: Large)   Pulse 83   Temp 98.1 ?F (36.7 ?C) (Oral)   Ht 6' (1.829 m)   Wt 246 lb 3.2 oz (111.7 kg)   SpO2 98%   BMI 33.39 kg/m?  ? ?GEN: A/Ox3; pleasant , NAD, well nourished  ?  ?HEENT:  [place]/AT,  NOSE-clear, THROAT-clear, no lesions, no postnasal drip or exudate noted.  ? ?NECK:  Supple w/ fair ROM; no JVD; normal carotid impulses w/o bruits; no thyromegaly or nodules palpated; no lymphadenopathy.   ? ?RESP  Clear  P & A; w/o, wheezes/ rales/ or rhonchi. no accessory muscle use, no dullness to percussion ? ?CARD:  RRR, no m/r/g, no peripheral edema, pulses intact, no cyanosis or clubbing. TED stockings  ? ?GI:   Soft & nt; nml bowel sounds; no organomegaly or masses detected.  ? ?Musco: Warm bil, no deformities or joint swelling noted.  ? ?Neuro: alert, no focal deficits noted.   ? ?Skin: Warm, no lesions or rashes ? ? ? ?Lab Results: ? ? ? ?BNP ? ? ?ProBNP ?No results found for: PROBNP ? ?Imaging: ?No results found. ? ? ? ?   ? View : No data to display.  ?  ?  ?  ? ? ?No results found for: NITRICOXIDE ? ? ? ? ? ?Assessment & Plan:  ? 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 ?Repeat 2D echo 2021 showed improvement in pulmonary artery pressures and right ventricular systolic function. ? ?Plan  ?Patient Instructions  ?Continue on Xarelto 20 mg daily ?Activity as tolerated ?Support stockings ?Avoid nonsteroidal medication such as ibuprofen, Aleve, Excedrin, Motrin, etc. ?Follow-up with Dr. Lamonte Sakai in 1 year and as  needed ? ?  ? ? ?Left femoral vein DVT (HCC) ?History left DVT.  On lifelong anticoagulation.  Patient has some residual venous insufficiency in his left leg.  Continue with Ted support stockings. ? ?Venous insufficiency (chronic) (peripheral) ?Patient is encouraged on activity as tolerated.  Low-salt diet.  Keep legs elevated as needed.  Support stockings ? ? ? ? ?Rexene Edison, NP ?02/23/2022 ? ?

## 2022-05-03 ENCOUNTER — Telehealth: Payer: Self-pay | Admitting: Adult Health

## 2022-05-03 NOTE — Telephone Encounter (Signed)
Fax received from Dr. Jeani Hawking with [facility] to perform a Colonoscopy with propofol on patient.  Patient needs surgery clearance. Surgery is 06/09/2022. Patient was seen on 02/23/2022. Office protocol is a risk assessment can be sent to surgeon if patient has been seen in 60 days or less.   Sending to Rubye Oaks NP for risk assessment or recommendations if patient needs to be seen in office prior to surgical procedure.

## 2022-05-09 NOTE — Telephone Encounter (Signed)
OV notes and clearance form have been faxed back to [facility]. Nothing further needed at this time.

## 2022-05-09 NOTE — Telephone Encounter (Signed)
History of recurrent PE has been on lifelong anticoagulation therapy since February 2021.  We will need to hold Xarelto 2 days  prior to procedure

## 2023-03-24 ENCOUNTER — Other Ambulatory Visit: Payer: Self-pay | Admitting: Adult Health

## 2023-07-11 ENCOUNTER — Ambulatory Visit: Payer: BC Managed Care – PPO | Admitting: Emergency Medicine

## 2023-07-11 ENCOUNTER — Encounter: Payer: Self-pay | Admitting: Emergency Medicine

## 2023-07-11 VITALS — BP 110/68 | HR 80 | Ht 72.0 in | Wt 214.4 lb

## 2023-07-11 DIAGNOSIS — I2602 Saddle embolus of pulmonary artery with acute cor pulmonale: Secondary | ICD-10-CM | POA: Diagnosis not present

## 2023-07-11 DIAGNOSIS — R911 Solitary pulmonary nodule: Secondary | ICD-10-CM | POA: Diagnosis not present

## 2023-07-11 MED ORDER — RIVAROXABAN 20 MG PO TABS: 20.0000 mg | ORAL_TABLET | Freq: Every day | ORAL | 3 refills | Status: DC

## 2023-07-11 MED ORDER — RIVAROXABAN 20 MG PO TABS: 20.0000 mg | ORAL_TABLET | Freq: Every day | ORAL | 0 refills | Status: DC

## 2023-07-11 NOTE — Patient Instructions (Signed)
We will plan to continue your Xarelto 20 mg once daily.  This will be refilled for you today. Please call if you have any changes in your breathing, any new respiratory symptoms.  Call also if you notice any evidence of bleeding Follow Dr. Delton Coombes in September 2025, sooner if you have problems.

## 2023-07-11 NOTE — Progress Notes (Signed)
Subjective:    Patient ID: Duane Gregory, male    DOB: 10/08/1961, 62 y.o.   MRN: [id number]  HPI 62 year old man seen by me in May 2019 for submassive pulmonary embolism with an associated left upper lobe opacity concerning for pulmonary infarct versus nodule.  He was treated with Eliquis for 6 months.  It was felt that this was likely a provoked clot following a period of immobility.  He was diagnosed with COVID-19 in mid February, subsequently admitted with recurrent submassive pulmonary embolism and associated right heart strain 12/11/2019.  Found DVT in L leg as well. Echocardiogram showed preserved LVEF with RV systolic function severely reduced and an enlarged RV.  Thrombolysis was deferred.  He is now on Eliquis again, planning to treat life-long.   He feels much better, is able to exert now but is still limited some. He can carry groceries.   ROV 07/08/20 --follow-up visit for 58 who had a submassive pulmonary embolism in 02/2018 associated with the left upper lobe pulmonary infarct.  Unfortunately had a recurrent submassive pulmonary embolism with a left lower extremity DVT in the setting of COVID-19.  He did not undergo thrombolysis.  He had right heart strain and enlargement consistent with significant secondary PAH.  We repeated his echocardiogram 05/19/2020 and I have reviewed, shows intact LV function 60-65%, mildly reduced RV function with mild RV enlargement.  The PASP is normal. He feels well, is exercising without SOB. Good compliance w his eliquis.   ROV 07/11/2023 --Duane Gregory is a 62 year old never smoker with history of submassive pulmonary embolism and left lower extremity DVT in May 2019 when he had COVID-19.  His echocardiogram at that time showed RV strain, improved on a repeat echo 05/2020. He is a factor V Leiden heterozygote and the plan has been for him to remain on anticoagulation lifelong as long as tolerated. He has been working on his exercise routine. Walking 4-5 x  a week.    Review of Systems As per HPI     Objective:   Physical Exam  Vitals:   07/11/23 1528  BP: 110/68  Pulse: 80  SpO2: 97%  Weight: 214 lb 6.4 oz (97.3 kg)  Height: 6' (1.829 m)   Gen: Pleasant, well-nourished, in no distress,  normal affect  ENT: No lesions,  mouth clear,  oropharynx clear, no postnasal drip  Neck: No JVD, no stridor  Lungs: No use of accessory muscles, no crackles or wheezing on normal respiration, no wheeze on forced expiration  Cardiovascular: RRR, heart sounds normal, no murmur or gallops, no peripheral edema  Musculoskeletal: No deformities, no cyanosis or clubbing  Neuro: alert, awake, non focal  Skin: Warm, no lesions or rash     Assessment & Plan:  Pulmonary nodule Resolved  Acute saddle pulmonary embolism with acute cor pulmonale (HCC) Plan to continue Eliquis.  He is tolerating well.  He will let me know if he develops any new respiratory symptoms or any difficulty with the medication, evidence for bleeding, etc.  Levy Pupa, MD, PhD 07/11/2023, 3:51 PM [facility] [phone] or if no answer [phone]

## 2023-07-11 NOTE — Assessment & Plan Note (Signed)
Resolved

## 2023-07-11 NOTE — Assessment & Plan Note (Signed)
Plan to continue Eliquis.  He is tolerating well.  He will let me know if he develops any new respiratory symptoms or any difficulty with the medication, evidence for bleeding, etc.

## 2024-07-14 ENCOUNTER — Other Ambulatory Visit: Payer: Self-pay | Admitting: Emergency Medicine

## 2024-07-17 ENCOUNTER — Other Ambulatory Visit: Payer: Self-pay | Admitting: Emergency Medicine

## 2024-07-17 NOTE — Telephone Encounter (Signed)
 Copied from CRM [id number]. Topic: Clinical - Medication Refill >> Jul 17, 2024  8:28 AM Isabell A wrote: Medication: rivaroxaban  (XARELTO ) 20 MG TABS tablet  Has the patient contacted their pharmacy? Yes (Agent: If no, request that the patient contact the pharmacy for the refill. If patient does not wish to contact the pharmacy document the reason why and proceed with request.) (Agent: If yes, when and what did the pharmacy advise?)  This is the patient's preferred pharmacy:  [id number] [address] [place], [place] - [address] [place] [address] Phone: [phone] Fax: [phone]  Is this the correct pharmacy for this prescription? Yes If no, delete pharmacy and type the correct one.   Has the prescription been filled recently? Yes  Is the patient out of the medication? Yes  Has the patient been seen for an appointment in the last year OR does the patient have an upcoming appointment? No  Can we respond through MyChart? No  Agent: Please be advised that Rx refills may take up to 3 business days. We ask that you follow-up with your pharmacy.

## 2024-07-22 MED ORDER — RIVAROXABAN 20 MG PO TABS: 20.0000 mg | ORAL_TABLET | Freq: Every day | ORAL | 0 refills | Status: AC
